# Patient Record
Sex: Male | Born: 1940 | Race: White | Hispanic: No | Marital: Married | Smoking: Former smoker
Health system: Southern US, Community
[De-identification: ages and names within clinical notes are randomized; demographics above are authoritative.]

## PROBLEM LIST (undated history)

## (undated) DIAGNOSIS — Z01810 Encounter for preprocedural cardiovascular examination: Secondary | ICD-10-CM

## (undated) DIAGNOSIS — N529 Male erectile dysfunction, unspecified: Secondary | ICD-10-CM

## (undated) DIAGNOSIS — Z9889 Other specified postprocedural states: Secondary | ICD-10-CM

## (undated) DIAGNOSIS — I10 Essential (primary) hypertension: Secondary | ICD-10-CM

## (undated) DIAGNOSIS — R17 Unspecified jaundice: Secondary | ICD-10-CM

## (undated) DIAGNOSIS — M169 Osteoarthritis of hip, unspecified: Secondary | ICD-10-CM

## (undated) DIAGNOSIS — R112 Nausea with vomiting, unspecified: Secondary | ICD-10-CM

## (undated) DIAGNOSIS — C801 Malignant (primary) neoplasm, unspecified: Secondary | ICD-10-CM

## (undated) DIAGNOSIS — B0229 Other postherpetic nervous system involvement: Secondary | ICD-10-CM

## (undated) DIAGNOSIS — M1990 Unspecified osteoarthritis, unspecified site: Secondary | ICD-10-CM

## (undated) DIAGNOSIS — I499 Cardiac arrhythmia, unspecified: Secondary | ICD-10-CM

## (undated) DIAGNOSIS — M1612 Unilateral primary osteoarthritis, left hip: Secondary | ICD-10-CM

## (undated) DIAGNOSIS — N401 Enlarged prostate with lower urinary tract symptoms: Secondary | ICD-10-CM

## (undated) DIAGNOSIS — E291 Testicular hypofunction: Secondary | ICD-10-CM

## (undated) DIAGNOSIS — I2510 Atherosclerotic heart disease of native coronary artery without angina pectoris: Secondary | ICD-10-CM

## (undated) DIAGNOSIS — D045 Carcinoma in situ of skin of trunk: Secondary | ICD-10-CM

## (undated) DIAGNOSIS — Z96642 Presence of left artificial hip joint: Secondary | ICD-10-CM

## (undated) DIAGNOSIS — E7849 Other hyperlipidemia: Secondary | ICD-10-CM

## (undated) HISTORY — DX: Unilateral primary osteoarthritis, left hip: M16.12

## (undated) HISTORY — PX: UPPER GASTROINTESTINAL ENDOSCOPY: SHX188

## (undated) HISTORY — DX: Encounter for preprocedural cardiovascular examination: Z01.810

## (undated) HISTORY — DX: Testicular hypofunction: E29.1

## (undated) HISTORY — DX: Presence of left artificial hip joint: Z96.642

## (undated) HISTORY — PX: CORONARY ANGIOPLASTY: SHX604

## (undated) HISTORY — DX: Benign prostatic hyperplasia with lower urinary tract symptoms: N40.1

## (undated) HISTORY — PX: COLON SURGERY: SHX602

## (undated) HISTORY — PX: CARDIAC CATHETERIZATION: SHX172

## (undated) HISTORY — PX: HERNIA REPAIR: SHX51

## (undated) HISTORY — PX: CHOLECYSTECTOMY: SHX55

## (undated) HISTORY — PX: COLONOSCOPY: SHX174

## (undated) HISTORY — DX: Essential (primary) hypertension: I10

## (undated) HISTORY — DX: Other hyperlipidemia: E78.49

## (undated) HISTORY — DX: Male erectile dysfunction, unspecified: N52.9

## (undated) HISTORY — DX: Osteoarthritis of hip, unspecified: M16.9

## (undated) HISTORY — DX: Atherosclerotic heart disease of native coronary artery without angina pectoris: I25.10

## (undated) HISTORY — DX: Other postherpetic nervous system involvement: B02.29

## (undated) HISTORY — PX: HIP ARTHROPLASTY: SHX981

---

## 2010-07-04 ENCOUNTER — Encounter (HOSPITAL_COMMUNITY)
Admission: RE | Admit: 2010-07-04 | Discharge: 2010-07-04 | Disposition: A | Discharge status: Home / Self Care | Payer: Medicare HMO | Source: Ambulatory Visit | Attending: Orthopedic Surgery | Admitting: Orthopedic Surgery

## 2010-07-04 ENCOUNTER — Other Ambulatory Visit (HOSPITAL_COMMUNITY): Payer: Self-pay | Admitting: Orthopedic Surgery

## 2010-07-04 ENCOUNTER — Ambulatory Visit (HOSPITAL_COMMUNITY)
Admission: RE | Admit: 2010-07-04 | Discharge: 2010-07-04 | Disposition: A | Discharge status: Home / Self Care | Payer: Medicare HMO | Source: Ambulatory Visit | Attending: Orthopedic Surgery | Admitting: Orthopedic Surgery

## 2010-07-04 DIAGNOSIS — M169 Osteoarthritis of hip, unspecified: Secondary | ICD-10-CM

## 2010-07-04 DIAGNOSIS — Z01818 Encounter for other preprocedural examination: Secondary | ICD-10-CM | POA: Insufficient documentation

## 2010-07-04 LAB — DIFFERENTIAL
Eosinophils Absolute: 0.1 10*3/uL (ref 0.0–0.7)
Eosinophils Relative: 1 % (ref 0–5)
Lymphs Abs: 2.8 10*3/uL (ref 0.7–4.0)
Monocytes Absolute: 0.5 10*3/uL (ref 0.1–1.0)

## 2010-07-04 LAB — CBC
MCH: 31.4 pg (ref 26.0–34.0)
MCHC: 34.8 g/dL (ref 30.0–36.0)
MCV: 90.1 fL (ref 78.0–100.0)
Platelets: 284 10*3/uL (ref 150–400)
RDW: 13.1 % (ref 11.5–15.5)
WBC: 8.6 10*3/uL (ref 4.0–10.5)

## 2010-07-04 LAB — URINE MICROSCOPIC-ADD ON

## 2010-07-04 LAB — TYPE AND SCREEN: ABO/RH(D): A POS

## 2010-07-04 LAB — URINALYSIS, ROUTINE W REFLEX MICROSCOPIC
Protein, ur: NEGATIVE mg/dL
Urobilinogen, UA: 1 mg/dL (ref 0.0–1.0)

## 2010-07-04 LAB — SURGICAL PCR SCREEN
MRSA, PCR: NEGATIVE
Staphylococcus aureus: NEGATIVE

## 2010-07-04 LAB — BASIC METABOLIC PANEL
Calcium: 9.3 mg/dL (ref 8.4–10.5)
Creatinine, Ser: 0.98 mg/dL (ref 0.4–1.5)
GFR calc Af Amer: >60 mL/min (ref >60)

## 2010-07-04 LAB — APTT: aPTT: 28 seconds (ref 24–37)

## 2010-07-07 ENCOUNTER — Inpatient Hospital Stay (HOSPITAL_COMMUNITY)
Admission: RE | Admit: 2010-07-07 | Discharge: 2010-07-09 | DRG: 470 | Disposition: A | Discharge status: Home Health | Payer: Medicare HMO | Source: Ambulatory Visit | Attending: Orthopedic Surgery | Admitting: Orthopedic Surgery

## 2010-07-07 ENCOUNTER — Inpatient Hospital Stay (HOSPITAL_COMMUNITY): Payer: Medicare HMO

## 2010-07-07 DIAGNOSIS — K219 Gastro-esophageal reflux disease without esophagitis: Secondary | ICD-10-CM | POA: Diagnosis present

## 2010-07-07 DIAGNOSIS — M1610 Unilateral primary osteoarthritis, unspecified hip: Principal | ICD-10-CM | POA: Diagnosis present

## 2010-07-07 DIAGNOSIS — M169 Osteoarthritis of hip, unspecified: Principal | ICD-10-CM | POA: Diagnosis present

## 2010-07-08 LAB — BASIC METABOLIC PANEL
Calcium: 8.6 mg/dL (ref 8.4–10.5)
Creatinine, Ser: 0.97 mg/dL (ref 0.4–1.5)
GFR calc non Af Amer: >60 mL/min (ref >60)
Glucose, Bld: 154 mg/dL — ABNORMAL HIGH (ref 70–99)
Sodium: 141 mEq/L (ref 135–145)

## 2010-07-08 LAB — CBC
MCH: 30.7 pg (ref 26.0–34.0)
Platelets: 266 10*3/uL (ref 150–400)
RBC: 4.36 MIL/uL (ref 4.22–5.81)
RDW: 13.2 % (ref 11.5–15.5)
WBC: 11.6 10*3/uL — ABNORMAL HIGH (ref 4.0–10.5)

## 2010-07-08 LAB — PROTIME-INR: Prothrombin Time: 13.9 seconds (ref 11.6–15.2)

## 2010-07-09 LAB — CBC
HCT: 34 % — ABNORMAL LOW (ref 39.0–52.0)
MCH: 30.8 pg (ref 26.0–34.0)
MCHC: 33.2 g/dL (ref 30.0–36.0)
MCV: 92.6 fL (ref 78.0–100.0)
Platelets: 204 10*3/uL (ref 150–400)
RDW: 13.6 % (ref 11.5–15.5)
WBC: 7.9 10*3/uL (ref 4.0–10.5)

## 2010-07-10 NOTE — Op Note (Signed)
NAME:  [name], [name]               ACCOUNT NO.:  [id number]  MEDICAL RECORD NO.:  [id number]           PATIENT TYPE:  I  LOCATION:  5034                         FACILITY:  [facility]  PHYSICIAN:  Feliberto Gottron. Turner Daniels, M.D.   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  PROCEDURE:  Right total hip arthroplasty using DePuy 60-mm Pinnacle cup, 40-mm metal liner, central occluder.  On the femoral side, +0 40-mm metal ball, 20 x 15 x 42 SROM stem, 20FXXL cone.  SURGEON:  Feliberto Gottron. Turner Daniels, MD  FIRST ASSISTANT:  Shirl Harris, PA  ANESTHETIC:  General endotracheal.  ESTIMATED BLOOD LOSS:  400 mL.  FLUID REPLACEMENT:  Crystalloid 1500 mL.  DRAINS PLACED:  None.  TOURNIQUET TIME:  None.  INDICATIONS FOR PROCEDURE:  A 70 year old man with end-stage arthritis of both hips, right worse than left, has failed conservative measures, desires elective hip replacement.  He is down to bone-on-bone changes on his x-ray with protrusio and very limited motion.  Risks and benefits of surgery discussed, questions answered.  DESCRIPTION OF PROCEDURE:  The patient identified by armband, received preoperative IV Ancef 2 g in the holding area at [facility], taken to the operating room 5, appropriate anesthetic monitors were attached, general endotracheal anesthesia induced.  The patient in supine position, rolled into the left lateral decubitus position, fixed there with a Stulberg Mark II pelvic clamp keeping the pelvis vertical. Right lower extremity prepped and draped in usual sterile fashion from the ankle to the hemipelvis.  Time-out procedure performed.  Skin along the lateral hip and thigh infiltrated with 10 mL of 0.5% Marcaine  and epinephrine solution and a 14-cm incision allowing posterolateral approach to the hip was made through the skin and subcutaneous tissue down to the level of the IT band cut in line with skin incision exposing the greater trochanter.  We then placed cobra retractors between the gluteus minimus in the superior hip joint capsule and the quadratus femoris in the inferior hip joint capsule, exposing the short external rotators and the piriformis which were tagged with a #2 Ethibond suture and cut off their insertion on the intertrochanteric crest.  This exposed the posterior aspect of the hip joint capsule which was developed into an acetabular-based flap going from posterior-superior off the acetabular rim out over the femoral neck and exiting posterior- inferior.  This was likewise tagged with two #2 Ethibond sutures and clearly exposed the arthritic femoral head which had very large posterior and inferior osteophytes.  Hip was flexed and internally rotated dislocating the arthritic femoral head and a standard neck cut performed one fingerbreadth above the lesser trochanter.  The femoral head was then extracted and the proximal femur translated anteriorly levering off the anterior column with a Hohmann retractor and placing a spike Cobra retractor in the cotyloid notch and then a posterior- inferior wing retractor at the junction of  the ischium and the acetabulum.  There was very little labrum to remove since it was all calcified from the protrusio, and using rongeurs we actually removed about one-quarter inch of the acetabular rim pretty much circumferentially to get down to the true acetabulum.  We then started out with a 55-mm basket reamer and reamed up to 59 mm obtaining good coverage in all quadrants in preparation for a 60-mm Pinnacle shell. The edge was touched with a 60-mm reamer and a 60-mm Pinnacle shell with no dome screws was hammered into place in 40 degrees of abduction  and about 20 degrees of anteversion.  More peripheral osteophytes were then removed.  Central occluder placed and a 40-mm metal liner was hammered into place.  Hip flexed and internally rotated exposing the proximal femur which was entered with a box cutting chisel followed by axial reamers going in 2-mm increments from 18 to 14 and then 1-mm increments from 14 to 15 and half millimeter increments to 15.5 mm.  We went halfway down with the 16-mm reamer and actually had gotten good chatter right around 14 mm.  We then conically reamed up to a 12F cone to the appropriate depth for a 42 base neck and then milled the calcar to an XXL calcar.  A trial 12FXXL cone was then hammered into place followed by a trial stem with a +0 40-mm head.  The patient had excessive anteversion of the femur about 45 degrees.  We retroverted the trial about 20 degrees in relation to the calcar and reduced the hip.  The hip could not be dislocated anteriorly in extension with external rotation and flexion to a 90 with internal rotation of 75 was stable.  At this point, the trial components were removed.  The wound irrigated out with normal saline solution.  A 12FXXL cone was then hammered into place followed by a 20 x 15 x 42 x 150 SROM stem retroverted 20 degrees in relation to the calcar.  This still gave [place] an anteversion of about 20 degrees.  Combined version between the cup and the stem was 40 to 45 degrees.  We then hammered into place a +0 40-mm metal head, reduced the hip, checked stability, it was excellent.  Wound irrigated out with normal saline solution at this point one more time.  Capsular flap and short external rotators were repaired back to the intertrochanteric crest through drill holes using the #2 Ethibond suture.  The IT band closed with running #1 Vicryl suture, subcutaneous tissue with 0 and 2-0 undyed Vicryl suture.  Skin was closed with running interlocking 3-0 nylon suture.  A Mepilex  dressing was applied.  The patient unclamped, rolled supine, awakened, extubated, and taken to the recovery room without difficulty.     Feliberto Gottron. Turner Daniels, M.D.     Ovid Curd  D:  07/07/2010  T:  07/07/2010  Job:  [id number]  Electronically Signed by Gean Birchwood M.D. on 07/10/2010 08:08:59 PM

## 2010-08-10 NOTE — Discharge Summary (Signed)
  NAME:  [name], [name]               ACCOUNT NO.:  [id number]  MEDICAL RECORD NO.:  [id number]           PATIENT TYPE:  I  LOCATION:  5034                         FACILITY:  [facility]  PHYSICIAN:  [name], M.D.   DATE OF BIRTH:  Jan 27, 1941  DATE OF ADMISSION:  07/07/2010 DATE OF DISCHARGE:  07/09/2010                              DISCHARGE SUMMARY   CHIEF COMPLAINT:  Right hip pain.  HISTORY OF PRESENT ILLNESS:  This is a 70 year old gentleman who complains of severe unremitting pain in his right hip despite activity modification and pain medicines.  He now desires a surgical intervention.  All risks and benefits of surgery were discussed with the patient.  PAST MEDICAL HISTORY:  Significant for peptic ulcer disease.  PAST SURGICAL HISTORY:  Significant for cholecystectomy and back surgery.  He has no known drug allergies.  SOCIAL HISTORY:  He denies the use of tobacco or alcohol.  He is married and retired.  FAMILY HISTORY:  Positive for osteoarthritis.  PHYSICAL EXAMINATION:  Gross examination of the right hip demonstrates limited range of motion and significant pain with attempts at internal rotation.  He is neurovascularly intact.  X-rays demonstrate bone-on-bone degenerative joint disease in the right hip with flattening of the femoral head.  PREOP LABS:  White blood cells 8.6, red blood cells 5.26, hemoglobin 16.5, hematocrit 47.4, platelets 284.  PT 13, INR 0.96, PTT 28.  Sodium 140, potassium 4.6, chloride 106, glucose 93, BUN 11, creatinine 0.98. Urinalysis demonstrates moderate leukocyte esterase, otherwise within normal limits.  HOSPITAL COURSE:  [name] was admitted to [facility] on July 07, 2010, when he underwent right total hip arthroplasty.  The procedure was performed by Dr. Gean Birchwood and the patient tolerated it well. Perioperative Foley catheter was placed.  He was transferred to the floor on Lovenox and Coumadin for DVT prophylaxis.  On  the first postoperative day, he was awake and alert.  He complained of nausea and vomiting.  So, his pain medicine was adjusted.  Hemoglobin was 13.4. Surgical dressing was clean.  On the second postoperative day, he was awake and alert and doing well with physical therapy.  He reported significant improvement in nausea and he was discharged home.  DISPOSITION:  The patient was discharged home on July 09, 2010.  He was weightbearing as tolerated and would return to the clinic in 10 days for x-rays and suture removal.  Home Health would manage his wound, Coumadin and physical therapy.  His target INR was 1.5 to 2.0.  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     Shirl Harris, PA   ______________________________ [name], M.D.    JW/MEDQ  D:  08/02/2010  T:  08/03/2010  Job:  [id number]  Electronically Signed by Shirl Harris PA on 08/08/2010 04:18:04 PM Electronically Signed by Gean Birchwood M.D. on 08/10/2010 07:40:18 AM

## 2015-05-05 DIAGNOSIS — Z01810 Encounter for preprocedural cardiovascular examination: Secondary | ICD-10-CM

## 2015-05-05 HISTORY — DX: Encounter for preprocedural cardiovascular examination: Z01.810

## 2015-07-01 ENCOUNTER — Other Ambulatory Visit (HOSPITAL_COMMUNITY): Payer: Self-pay | Admitting: Orthopaedic Surgery

## 2015-07-05 ENCOUNTER — Encounter (HOSPITAL_COMMUNITY): Payer: Self-pay

## 2015-07-05 ENCOUNTER — Encounter (HOSPITAL_COMMUNITY)
Admission: RE | Admit: 2015-07-05 | Discharge: 2015-07-05 | Disposition: A | Discharge status: Home / Self Care | Payer: Medicare HMO | Source: Ambulatory Visit | Attending: Orthopaedic Surgery | Admitting: Orthopaedic Surgery

## 2015-07-05 DIAGNOSIS — Z7982 Long term (current) use of aspirin: Secondary | ICD-10-CM | POA: Insufficient documentation

## 2015-07-05 DIAGNOSIS — I10 Essential (primary) hypertension: Secondary | ICD-10-CM | POA: Insufficient documentation

## 2015-07-05 DIAGNOSIS — Z7902 Long term (current) use of antithrombotics/antiplatelets: Secondary | ICD-10-CM | POA: Diagnosis not present

## 2015-07-05 DIAGNOSIS — Z87891 Personal history of nicotine dependence: Secondary | ICD-10-CM | POA: Insufficient documentation

## 2015-07-05 DIAGNOSIS — Z79899 Other long term (current) drug therapy: Secondary | ICD-10-CM | POA: Insufficient documentation

## 2015-07-05 DIAGNOSIS — I2510 Atherosclerotic heart disease of native coronary artery without angina pectoris: Secondary | ICD-10-CM | POA: Insufficient documentation

## 2015-07-05 DIAGNOSIS — M1612 Unilateral primary osteoarthritis, left hip: Secondary | ICD-10-CM | POA: Diagnosis not present

## 2015-07-05 DIAGNOSIS — Z955 Presence of coronary angioplasty implant and graft: Secondary | ICD-10-CM | POA: Insufficient documentation

## 2015-07-05 DIAGNOSIS — Z01818 Encounter for other preprocedural examination: Secondary | ICD-10-CM | POA: Diagnosis not present

## 2015-07-05 DIAGNOSIS — Z01812 Encounter for preprocedural laboratory examination: Secondary | ICD-10-CM | POA: Diagnosis not present

## 2015-07-05 HISTORY — DX: Malignant (primary) neoplasm, unspecified: C80.1

## 2015-07-05 HISTORY — DX: Carcinoma in situ of skin of trunk: D04.5

## 2015-07-05 HISTORY — DX: Essential (primary) hypertension: I10

## 2015-07-05 HISTORY — DX: Cardiac arrhythmia, unspecified: I49.9

## 2015-07-05 HISTORY — DX: Nausea with vomiting, unspecified: R11.2

## 2015-07-05 HISTORY — DX: Unspecified osteoarthritis, unspecified site: M19.90

## 2015-07-05 HISTORY — DX: Unspecified jaundice: R17

## 2015-07-05 HISTORY — DX: Other specified postprocedural states: Z98.890

## 2015-07-05 LAB — BASIC METABOLIC PANEL
ANION GAP: 10 (ref 5–15)
BUN: 23 mg/dL — ABNORMAL HIGH (ref 6–20)
CO2: 26 mmol/L (ref 22–32)
Calcium: 9.4 mg/dL (ref 8.9–10.3)
Chloride: 106 mmol/L (ref 101–111)
Creatinine, Ser: 1.23 mg/dL (ref 0.61–1.24)
GFR calc Af Amer: >60 mL/min (ref >60)
GFR, EST NON AFRICAN AMERICAN: 57 mL/min — AB (ref >60)
GLUCOSE: 93 mg/dL (ref 65–99)
POTASSIUM: 4.4 mmol/L (ref 3.5–5.1)
Sodium: 142 mmol/L (ref 135–145)

## 2015-07-05 LAB — CBC
HEMATOCRIT: 43.9 % (ref 39.0–52.0)
HEMOGLOBIN: 14.4 g/dL (ref 13.0–17.0)
MCH: 30.7 pg (ref 26.0–34.0)
MCHC: 32.8 g/dL (ref 30.0–36.0)
MCV: 93.6 fL (ref 78.0–100.0)
Platelets: 185 10*3/uL (ref 150–400)
RBC: 4.69 MIL/uL (ref 4.22–5.81)
RDW: 13.1 % (ref 11.5–15.5)
WBC: 9.1 10*3/uL (ref 4.0–10.5)

## 2015-07-05 LAB — SURGICAL PCR SCREEN
MRSA, PCR: NEGATIVE
STAPHYLOCOCCUS AUREUS: NEGATIVE

## 2015-07-05 NOTE — Pre-Procedure Instructions (Signed)
[name]  07/05/2015      [facility], [place] [id number] [place] [id number] Phone: [phone] Fax: [phone]    Your procedure is scheduled on Tuesday  07/12/15  Report to [facility] Admitting at 130 P.M.  Call this number if you have problems the morning of surgery:  [phone]   Remember:  Do not eat food or drink liquids after midnight.  Take these medicines the morning of surgery with A SIP OF WATER  HYDROCODONE IF NEEDED, LORATADINE, TAMSULOSIN (FLOMAX)     (STOP ASPIRIN AND PLAVIX AS DIRECTED, STOP MULTIVITAMIN, VITAMINS, HERBAL MEDICINES)   Do not wear jewelry, make-up or nail polish.  Do not wear lotions, powders, or perfumes.  You may wear deodorant.  Do not shave 48 hours prior to surgery.  Men may shave face and neck.  Do not bring valuables to the hospital.  [facility] is not responsible for any belongings or valuables.  Contacts, dentures or bridgework may not be worn into surgery.  Leave your suitcase in the car.  After surgery it may be brought to your room.  For patients admitted to the hospital, discharge time will be determined by your treatment team.  Patients discharged the day of surgery will not be allowed to drive home.   Name and phone number of your driver:   Special instructions:  [place] - Preparing for Surgery  Before surgery, you can play an important role.  Because skin is not sterile, your skin needs to be as free of germs as possible.  You can reduce the number of germs on you skin by washing with CHG (chlorahexidine gluconate) soap before surgery.  CHG is an antiseptic cleaner which kills germs and bonds with the skin to continue killing germs even after washing.  Please DO NOT use if you have an allergy to CHG or antibacterial soaps.  If your skin becomes reddened/irritated stop using the CHG and inform your nurse when you arrive at Short Stay.  Do  not shave (including legs and underarms) for at least 48 hours prior to the first CHG shower.  You may shave your face.  Please follow these instructions carefully:   1.  Shower with CHG Soap the night before surgery and the                                morning of Surgery.  2.  If you choose to wash your hair, wash your hair first as usual with your       normal shampoo.  3.  After you shampoo, rinse your hair and body thoroughly to remove the                      Shampoo.  4.  Use CHG as you would any other liquid soap.  You can apply chg directly       to the skin and wash gently with scrungie or a clean washcloth.  5.  Apply the CHG Soap to your body ONLY FROM THE NECK DOWN.        Do not use on open wounds or open sores.  Avoid contact with your eyes,       ears, mouth and genitals (private parts).  Wash genitals (private parts)       with your normal  soap.  6.  Wash thoroughly, paying special attention to the area where your surgery        will be performed.  7.  Thoroughly rinse your body with warm water from the neck down.  8.  DO NOT shower/wash with your normal soap after using and rinsing off       the CHG Soap.  9.  Pat yourself dry with a clean towel.            10.  Wear clean pajamas.            11.  Place clean sheets on your bed the night of your first shower and do not        sleep with pets.  Day of Surgery  Do not apply any lotions/deoderants the morning of surgery.  Please wear clean clothes to the hospital/surgery center.    Please read over the following fact sheets that you were given. Pain Booklet, Coughing and Deep Breathing, Total Joint Packet, MRSA Information and Surgical Site Infection Prevention

## 2015-07-05 NOTE — Progress Notes (Signed)
   07/05/15 1407  OBSTRUCTIVE SLEEP APNEA  Have you ever been diagnosed with sleep apnea through a sleep study? No  Do you snore loudly (loud enough to be heard through closed doors)?  1  Do you often feel tired, fatigued, or sleepy during the daytime (such as falling asleep during driving or talking to someone)? 0  Has anyone observed you stop breathing during your sleep? 1  Do you have, or are you being treated for high blood pressure? 1  BMI more than 35 kg/m2? 0  Age > 50 (1-yes) 1  Neck circumference greater than:Male 16 inches or larger, Male 17inches or larger? 0  Male Gender (Yes=1) 1  Obstructive Sleep Apnea Score 5  Score 5 or greater  Results sent to PCP

## 2015-07-07 ENCOUNTER — Encounter (HOSPITAL_COMMUNITY): Payer: Self-pay | Admitting: Vascular Surgery

## 2015-07-07 NOTE — Progress Notes (Addendum)
Anesthesia Chart Review: Patient is a 75 year old male scheduled for left THA, anterior approach on 07/12/15 by [name].   History includes former smoker, HTN, post-operative N/V, CAD s/p PCI, dysrhythmia, skin cancer (SCC), childhood jaundice, cholecystectomy.  PCP is [name]. Cardiologist is [name] in [place], last visit 05/18/15 (see Care Everywhere). He also saw [name] on 05/05/15 for pre-operative assessment for colonoscopy. His note states, "As far as his preoperative risk stratification evaluation is concerned I recommended. He undergo Lexiscan sestamibi to assess for any objective evidence of obstructive coronary artery disease at this time. His because he leads a very sedentary lifestyle and has multiple risk factors for coronary artery disease. He and his wife tell me that they're not keen on it at this time and I respect her wishes."  Meds include ASA 81mg , Plavix, Norco, Lopressor, Claritin, Nitro, Zocor, Flomax, valsartan-HCTZ. Plavix held starting 07/05/15.  PAT Vitals: BP 131/50, HR 59, RR 20, T 36.8C, 98% RA.  07/05/15 EKG: SB at 58 bpm.  Preoperative labs noted.   Records still pending from [facility], [facility], and [facility]. I will also inquire from [name] office if any surgical clearance was obtained.  [name] [facility] Short Stay Center/Anesthesiology Phone [phone] 07/07/2015 6:06 PM  Addendum:   12/12/12 Stress Echo [facility]):  ECG showed ischemic ST changes. Overall LV systolic function is normal with EF between 55-60%. Echo images at peak stress demonstrated new regional wall motion abnormalities with hypokinesia of the mid anteroseptal segment and akinesia of the basilar septal and basilar inferior segments, this is a multivessel pattern of ischemia. EKG and echocardiographic evidence of inducible ischemia at achieved workload (multivessel CAD). LHC recommended.  12/17/12  Cardiac cath [facility]):  LMCA: Normal LAD: Lesion on Prox LAD:  Distal subsection. 80% stenosis.  DIAG1: Mid subsection. 75%. LCX: Normal RCA: Proximal subsection. 95% stenosis. RAMUS: 30% stenosis. SUMMARY:  1. Severe Prox LAD, DIAG, and RCA disease. 2. LV gram not done due to dye load, LVEF is 55% by stress echo. INTERVENTIONS: Successful PCI/Xience DES of the proximal LAD. Successful PCI/Xience DES of the mid Braxton. Successful PCI/Xience DES of the proximal RCA. RECOMMENDATIONS: Dual anti-platelet therapy with Ticagrelor and ASA 81 mg for at least 12 months is recommended.  He is only 2 1/2 years out from his coronary PCI, but pre-operative evaluation with [name] on 05/05/15 for colonoscopy recommended a Lexiscan. Patient was not keen on this idea at the time. Patient has since been evaluated by [name] (his reported primary cardiologist), but there is no mention that he is aware of plans for THA. I discussed above with anesthesiologist [name]. Since patient is now scheduled for a intermediate risk procedure, we would like cardiology to clarify if additional preoperative testing is felt indicated prior to this procedure. I notified Sherrie at [name] office earlier today. She will get follow-up with cardiology for input.  [name] [facility] Short Stay Center/Anesthesiology Phone [phone] 07/08/2015 5:39 PM

## 2015-07-11 ENCOUNTER — Telehealth (HOSPITAL_COMMUNITY): Payer: Self-pay | Admitting: Vascular Surgery

## 2015-07-11 NOTE — Progress Notes (Addendum)
Left pt. Message of time change on primary phone line. Instructed him to arrive at 1255 on 07/12/15. Asked to return call that he received this notification.  Spoke to sherrie @ Dr. Trevor Mace office concerning cardiology clearance. She stated at present no up dates to tell me.

## 2015-07-11 NOTE — Progress Notes (Addendum)
Anesthesia follow-up: See my anesthesia note from 07/07/15 with 07/08/15 addendum. Sherrie contacted cardiologist Dr. Joya Gaskins office for clearance but was told he could not see patient for another few weeks. Left THA scheduled for 07/12/15 was canceled. In the meantime, patient was able to get an appointment with Dr. Danie Binder (partner to Dr. Bettina Gavia) this afternoon. His note dated 07/11/15 (see Care Everywhere), states, "Patient is at low/acceptable cardiac risk for planned hip arthroplasty tomorrow, and no further preoperative cardiac testing is required. PLAN: Proceed with the hip arthroplasty tomorrow as planned. Follow-up with Dr. Bettina Gavia per routine. The patient is anticoagulated postoperatively I would treat with clopidogrel only. Resume aspirin plus clopidogrel ASAP postoperatively. All other medications including the metoprolol should be continued perioperatively." I notified [place]. She will have to discuss with Dr. Ninfa Linden when patient can be added back on to the OR schedule.  07/11/15 EKG ([facility]): SR at 72 bpm.  Myra Gianotti, PA-C [facility] Short Stay Center/Anesthesiology Phone [phone] 07/11/2015 4:56 PM

## 2015-07-12 ENCOUNTER — Inpatient Hospital Stay (HOSPITAL_COMMUNITY): Admission: RE | Admit: 2015-07-12 | Payer: Medicare HMO | Source: Ambulatory Visit | Admitting: Orthopaedic Surgery

## 2015-07-12 ENCOUNTER — Encounter (HOSPITAL_COMMUNITY): Admission: RE | Payer: Self-pay | Source: Ambulatory Visit

## 2015-07-12 SURGERY — ARTHROPLASTY, HIP, TOTAL, ANTERIOR APPROACH
Anesthesia: Choice | Laterality: Left

## 2015-07-19 ENCOUNTER — Encounter (HOSPITAL_COMMUNITY)
Admission: RE | Admit: 2015-07-19 | Discharge: 2015-07-19 | Disposition: A | Discharge status: Home / Self Care | Payer: Medicare HMO | Source: Ambulatory Visit | Attending: Orthopaedic Surgery | Admitting: Orthopaedic Surgery

## 2015-07-19 LAB — BASIC METABOLIC PANEL
ANION GAP: 11 (ref 5–15)
BUN: 17 mg/dL (ref 6–20)
CALCIUM: 9.3 mg/dL (ref 8.9–10.3)
CO2: 23 mmol/L (ref 22–32)
Chloride: 107 mmol/L (ref 101–111)
Creatinine, Ser: 1.37 mg/dL — ABNORMAL HIGH (ref 0.61–1.24)
GFR, EST AFRICAN AMERICAN: 58 mL/min — AB (ref >60)
GFR, EST NON AFRICAN AMERICAN: 50 mL/min — AB (ref >60)
Glucose, Bld: 97 mg/dL (ref 65–99)
Potassium: 4.5 mmol/L (ref 3.5–5.1)
Sodium: 141 mmol/L (ref 135–145)

## 2015-07-19 LAB — CBC
HCT: 41.2 % (ref 39.0–52.0)
HEMOGLOBIN: 13.9 g/dL (ref 13.0–17.0)
MCH: 31.4 pg (ref 26.0–34.0)
MCHC: 33.7 g/dL (ref 30.0–36.0)
MCV: 93 fL (ref 78.0–100.0)
Platelets: 198 10*3/uL (ref 150–400)
RBC: 4.43 MIL/uL (ref 4.22–5.81)
RDW: 13.1 % (ref 11.5–15.5)
WBC: 8.6 10*3/uL (ref 4.0–10.5)

## 2015-07-19 NOTE — Progress Notes (Signed)
Anesthesia follow-up: Patient is a 75 year old male scheduled for left THA, anterior approach on 07/21/15 by Dr. Jean Rosenthal. Procedure was initially cancelled due to need for cardiology clearance. See my notes from 07/08/15-07/11/15. Today he came in for updated labs.   Last dose Plavix was 07/15/15. Reportedly was told to hold for 6 days per Dr. Ninfa Linden.  Preoperative labs noted. Cr 1.37. CBC WNL.   Definitive anesthesia plan following evaluation on the day of surgery. Since Plavix only held for 6 days, he will likely get general anesthesia. I left a voice message with Sherrie at Dr. Trevor Mace office relaying this. Anesthesiologist Dr. Gifford Shave also updated.  [facility] Short Stay Center/Anesthesiology Phone [phone] 07/19/2015 5:31 PM

## 2015-07-20 MED ORDER — CEFAZOLIN SODIUM-DEXTROSE 2-3 GM-% IV SOLR
2.0000 g | INTRAVENOUS | Status: AC
  Administered 2015-07-21: 2 g via INTRAVENOUS
  Filled 2015-07-20: qty 50

## 2015-07-20 MED ORDER — TRANEXAMIC ACID 1000 MG/10ML IV SOLN
1000.0000 mg | INTRAVENOUS | Status: DC
  Filled 2015-07-20: qty 10

## 2015-07-21 ENCOUNTER — Encounter (HOSPITAL_COMMUNITY)
Admission: RE | Disposition: A | Discharge status: Home Health | Payer: Self-pay | Source: Ambulatory Visit | Attending: Orthopaedic Surgery

## 2015-07-21 ENCOUNTER — Inpatient Hospital Stay (HOSPITAL_COMMUNITY): Payer: Medicare HMO

## 2015-07-21 ENCOUNTER — Encounter (HOSPITAL_COMMUNITY): Payer: Self-pay | Admitting: *Deleted

## 2015-07-21 ENCOUNTER — Inpatient Hospital Stay (HOSPITAL_COMMUNITY): Payer: Medicare HMO | Admitting: Vascular Surgery

## 2015-07-21 ENCOUNTER — Inpatient Hospital Stay (HOSPITAL_COMMUNITY)
Admission: RE | Admit: 2015-07-21 | Discharge: 2015-07-23 | DRG: 470 | Disposition: A | Discharge status: Home Health | Payer: Medicare HMO | Source: Ambulatory Visit | Attending: Orthopaedic Surgery | Admitting: Orthopaedic Surgery

## 2015-07-21 ENCOUNTER — Inpatient Hospital Stay (HOSPITAL_COMMUNITY): Payer: Medicare HMO | Admitting: Anesthesiology

## 2015-07-21 DIAGNOSIS — N4289 Other specified disorders of prostate: Secondary | ICD-10-CM | POA: Diagnosis present

## 2015-07-21 DIAGNOSIS — M1612 Unilateral primary osteoarthritis, left hip: Principal | ICD-10-CM | POA: Diagnosis present

## 2015-07-21 DIAGNOSIS — Z96642 Presence of left artificial hip joint: Secondary | ICD-10-CM

## 2015-07-21 DIAGNOSIS — Z96641 Presence of right artificial hip joint: Secondary | ICD-10-CM | POA: Diagnosis present

## 2015-07-21 DIAGNOSIS — Z6829 Body mass index (BMI) 29.0-29.9, adult: Secondary | ICD-10-CM

## 2015-07-21 DIAGNOSIS — Z79899 Other long term (current) drug therapy: Secondary | ICD-10-CM

## 2015-07-21 DIAGNOSIS — Z87891 Personal history of nicotine dependence: Secondary | ICD-10-CM

## 2015-07-21 DIAGNOSIS — Z7982 Long term (current) use of aspirin: Secondary | ICD-10-CM

## 2015-07-21 DIAGNOSIS — I10 Essential (primary) hypertension: Secondary | ICD-10-CM | POA: Diagnosis present

## 2015-07-21 DIAGNOSIS — Z7902 Long term (current) use of antithrombotics/antiplatelets: Secondary | ICD-10-CM

## 2015-07-21 DIAGNOSIS — M1990 Unspecified osteoarthritis, unspecified site: Secondary | ICD-10-CM

## 2015-07-21 DIAGNOSIS — M169 Osteoarthritis of hip, unspecified: Secondary | ICD-10-CM

## 2015-07-21 HISTORY — DX: Osteoarthritis of hip, unspecified: M16.9

## 2015-07-21 HISTORY — DX: Presence of left artificial hip joint: Z96.642

## 2015-07-21 HISTORY — PX: TOTAL HIP ARTHROPLASTY: SHX124

## 2015-07-21 HISTORY — DX: Unilateral primary osteoarthritis, left hip: M16.12

## 2015-07-21 SURGERY — ARTHROPLASTY, HIP, TOTAL, ANTERIOR APPROACH
Anesthesia: General | Site: Hip | Laterality: Left

## 2015-07-21 MED ORDER — ONDANSETRON HCL 4 MG PO TABS: 4.0000 mg | ORAL_TABLET | Freq: Four times a day (QID) | ORAL | Status: DC | PRN

## 2015-07-21 MED ORDER — DEXTROSE 5 % IV SOLN
10.0000 mg | INTRAVENOUS | Status: DC | PRN
  Administered 2015-07-21: 25 ug/min via INTRAVENOUS

## 2015-07-21 MED ORDER — CLOPIDOGREL BISULFATE 75 MG PO TABS
75.0000 mg | ORAL_TABLET | Freq: Every day | ORAL | Status: DC
  Administered 2015-07-21 – 2015-07-23 (×3): 75 mg via ORAL
  Filled 2015-07-21 (×3): qty 1

## 2015-07-21 MED ORDER — SODIUM CHLORIDE 0.9 % IR SOLN
Status: DC | PRN
  Administered 2015-07-21: 1000 mL

## 2015-07-21 MED ORDER — DOCUSATE SODIUM 100 MG PO CAPS
100.0000 mg | ORAL_CAPSULE | Freq: Two times a day (BID) | ORAL | Status: DC
  Administered 2015-07-21 – 2015-07-23 (×4): 100 mg via ORAL
  Filled 2015-07-21 (×4): qty 1

## 2015-07-21 MED ORDER — CEFAZOLIN SODIUM 1-5 GM-% IV SOLN
1.0000 g | Freq: Four times a day (QID) | INTRAVENOUS | Status: AC
  Administered 2015-07-21 (×2): 1 g via INTRAVENOUS
  Filled 2015-07-21 (×2): qty 50

## 2015-07-21 MED ORDER — METHOCARBAMOL 500 MG PO TABS
ORAL_TABLET | ORAL | Status: AC
  Filled 2015-07-21: qty 1

## 2015-07-21 MED ORDER — LIDOCAINE HCL (CARDIAC) 20 MG/ML IV SOLN
INTRAVENOUS | Status: DC | PRN
  Administered 2015-07-21: 60 mg via INTRAVENOUS

## 2015-07-21 MED ORDER — ACETAMINOPHEN 10 MG/ML IV SOLN
INTRAVENOUS | Status: AC
  Filled 2015-07-21: qty 100

## 2015-07-21 MED ORDER — ALUM & MAG HYDROXIDE-SIMETH 200-200-20 MG/5ML PO SUSP: 30.0000 mL | ORAL | Status: DC | PRN

## 2015-07-21 MED ORDER — ADULT MULTIVITAMIN W/MINERALS CH
1.0000 | ORAL_TABLET | Freq: Every day | ORAL | Status: DC
  Administered 2015-07-22 – 2015-07-23 (×2): 1 via ORAL
  Filled 2015-07-21 (×2): qty 1

## 2015-07-21 MED ORDER — ASPIRIN EC 325 MG PO TBEC
325.0000 mg | DELAYED_RELEASE_TABLET | Freq: Every day | ORAL | Status: DC
  Administered 2015-07-22 – 2015-07-23 (×2): 325 mg via ORAL
  Filled 2015-07-21 (×2): qty 1

## 2015-07-21 MED ORDER — TAMSULOSIN HCL 0.4 MG PO CAPS
0.4000 mg | ORAL_CAPSULE | Freq: Every day | ORAL | Status: DC
  Administered 2015-07-21 – 2015-07-23 (×3): 0.4 mg via ORAL
  Filled 2015-07-21 (×3): qty 1

## 2015-07-21 MED ORDER — POLYETHYLENE GLYCOL 3350 17 G PO PACK: 17.0000 g | PACK | Freq: Every day | ORAL | Status: DC | PRN

## 2015-07-21 MED ORDER — HYDROMORPHONE HCL 1 MG/ML IJ SOLN
INTRAMUSCULAR | Status: AC
  Filled 2015-07-21: qty 1

## 2015-07-21 MED ORDER — MIDAZOLAM HCL 5 MG/5ML IJ SOLN
INTRAMUSCULAR | Status: DC | PRN
  Administered 2015-07-21: 2 mg via INTRAVENOUS

## 2015-07-21 MED ORDER — METOCLOPRAMIDE HCL 5 MG PO TABS: 5.0000 mg | ORAL_TABLET | Freq: Three times a day (TID) | ORAL | Status: DC | PRN

## 2015-07-21 MED ORDER — METOPROLOL TARTRATE 25 MG PO TABS
25.0000 mg | ORAL_TABLET | Freq: Every day | ORAL | Status: DC
  Administered 2015-07-21 – 2015-07-22 (×2): 25 mg via ORAL
  Filled 2015-07-21 (×2): qty 1

## 2015-07-21 MED ORDER — ACETAMINOPHEN 650 MG RE SUPP: 650.0000 mg | Freq: Four times a day (QID) | RECTAL | Status: DC | PRN

## 2015-07-21 MED ORDER — PHENYLEPHRINE HCL 10 MG/ML IJ SOLN
INTRAMUSCULAR | Status: DC | PRN
  Administered 2015-07-21 (×2): 120 ug via INTRAVENOUS

## 2015-07-21 MED ORDER — LACTATED RINGERS IV SOLN
INTRAVENOUS | Status: DC
  Administered 2015-07-21 (×2): via INTRAVENOUS

## 2015-07-21 MED ORDER — 0.9 % SODIUM CHLORIDE (POUR BTL) OPTIME
TOPICAL | Status: DC | PRN
  Administered 2015-07-21: 1000 mL

## 2015-07-21 MED ORDER — HYDROMORPHONE HCL 1 MG/ML IJ SOLN
0.2500 mg | INTRAMUSCULAR | Status: DC | PRN
  Administered 2015-07-21: 0.5 mg via INTRAVENOUS

## 2015-07-21 MED ORDER — VALSARTAN-HYDROCHLOROTHIAZIDE 320-12.5 MG PO TABS: 1.0000 | ORAL_TABLET | Freq: Every day | ORAL | Status: DC

## 2015-07-21 MED ORDER — LACTATED RINGERS IV SOLN: INTRAVENOUS | Status: DC

## 2015-07-21 MED ORDER — PROPOFOL 10 MG/ML IV BOLUS
INTRAVENOUS | Status: DC | PRN
  Administered 2015-07-21: 150 mg via INTRAVENOUS

## 2015-07-21 MED ORDER — METHOCARBAMOL 500 MG PO TABS
500.0000 mg | ORAL_TABLET | Freq: Four times a day (QID) | ORAL | Status: DC | PRN
  Administered 2015-07-21 – 2015-07-22 (×3): 500 mg via ORAL
  Filled 2015-07-21 (×3): qty 1

## 2015-07-21 MED ORDER — METHOCARBAMOL 1000 MG/10ML IJ SOLN
500.0000 mg | Freq: Four times a day (QID) | INTRAVENOUS | Status: DC | PRN
  Filled 2015-07-21: qty 5

## 2015-07-21 MED ORDER — HYDROMORPHONE HCL 1 MG/ML IJ SOLN
1.0000 mg | INTRAMUSCULAR | Status: DC | PRN
  Administered 2015-07-21 – 2015-07-22 (×3): 1 mg via INTRAVENOUS
  Filled 2015-07-21 (×4): qty 1

## 2015-07-21 MED ORDER — NEOSTIGMINE METHYLSULFATE 10 MG/10ML IV SOLN
INTRAVENOUS | Status: DC | PRN
  Administered 2015-07-21: 4 mg via INTRAVENOUS

## 2015-07-21 MED ORDER — ZOLPIDEM TARTRATE 5 MG PO TABS: 5.0000 mg | ORAL_TABLET | Freq: Every evening | ORAL | Status: DC | PRN

## 2015-07-21 MED ORDER — LORATADINE 10 MG PO TABS
10.0000 mg | ORAL_TABLET | Freq: Every day | ORAL | Status: DC
  Administered 2015-07-22 – 2015-07-23 (×2): 10 mg via ORAL
  Filled 2015-07-21 (×2): qty 1

## 2015-07-21 MED ORDER — FENTANYL CITRATE (PF) 100 MCG/2ML IJ SOLN
INTRAMUSCULAR | Status: DC | PRN
  Administered 2015-07-21: 100 ug via INTRAVENOUS
  Administered 2015-07-21 (×2): 50 ug via INTRAVENOUS
  Administered 2015-07-21 (×2): 100 ug via INTRAVENOUS

## 2015-07-21 MED ORDER — OXYCODONE HCL 5 MG PO TABS
5.0000 mg | ORAL_TABLET | ORAL | Status: DC | PRN
  Administered 2015-07-21: 10 mg via ORAL
  Administered 2015-07-21 – 2015-07-22 (×4): 15 mg via ORAL
  Administered 2015-07-22: 10 mg via ORAL
  Administered 2015-07-23: 15 mg via ORAL
  Filled 2015-07-21 (×4): qty 3
  Filled 2015-07-21: qty 2
  Filled 2015-07-21: qty 3
  Filled 2015-07-21: qty 2

## 2015-07-21 MED ORDER — ONDANSETRON HCL 4 MG/2ML IJ SOLN: 4.0000 mg | Freq: Four times a day (QID) | INTRAMUSCULAR | Status: DC | PRN

## 2015-07-21 MED ORDER — DIPHENHYDRAMINE HCL 12.5 MG/5ML PO ELIX: 12.5000 mg | ORAL_SOLUTION | ORAL | Status: DC | PRN

## 2015-07-21 MED ORDER — OXYCODONE HCL 5 MG PO TABS
ORAL_TABLET | ORAL | Status: AC
  Filled 2015-07-21: qty 3

## 2015-07-21 MED ORDER — SIMVASTATIN 40 MG PO TABS
40.0000 mg | ORAL_TABLET | Freq: Every evening | ORAL | Status: DC
  Administered 2015-07-21 – 2015-07-22 (×2): 40 mg via ORAL
  Filled 2015-07-21 (×2): qty 1

## 2015-07-21 MED ORDER — ROCURONIUM BROMIDE 100 MG/10ML IV SOLN
INTRAVENOUS | Status: DC | PRN
  Administered 2015-07-21: 50 mg via INTRAVENOUS

## 2015-07-21 MED ORDER — HYDROMORPHONE HCL 1 MG/ML IJ SOLN
0.2500 mg | INTRAMUSCULAR | Status: DC | PRN
  Administered 2015-07-21 (×2): 1 mg via INTRAVENOUS

## 2015-07-21 MED ORDER — GLYCOPYRROLATE 0.2 MG/ML IJ SOLN
INTRAMUSCULAR | Status: DC | PRN
  Administered 2015-07-21: 0.6 mg via INTRAVENOUS

## 2015-07-21 MED ORDER — IRBESARTAN 300 MG PO TABS
300.0000 mg | ORAL_TABLET | Freq: Every day | ORAL | Status: DC
  Administered 2015-07-22: 300 mg via ORAL
  Filled 2015-07-21 (×2): qty 1

## 2015-07-21 MED ORDER — SODIUM CHLORIDE 0.9 % IV SOLN
INTRAVENOUS | Status: DC
  Administered 2015-07-21: 16:00:00 via INTRAVENOUS

## 2015-07-21 MED ORDER — PHENOL 1.4 % MT LIQD: 1.0000 | OROMUCOSAL | Status: DC | PRN

## 2015-07-21 MED ORDER — MENTHOL 3 MG MT LOZG: 1.0000 | LOZENGE | OROMUCOSAL | Status: DC | PRN

## 2015-07-21 MED ORDER — HYDROCHLOROTHIAZIDE 12.5 MG PO CAPS
12.5000 mg | ORAL_CAPSULE | Freq: Every day | ORAL | Status: DC
  Administered 2015-07-22: 12.5 mg via ORAL
  Filled 2015-07-21 (×2): qty 1

## 2015-07-21 MED ORDER — ACETAMINOPHEN 10 MG/ML IV SOLN
1000.0000 mg | Freq: Once | INTRAVENOUS | Status: AC
  Administered 2015-07-21: 1000 mg via INTRAVENOUS

## 2015-07-21 MED ORDER — METOCLOPRAMIDE HCL 5 MG/ML IJ SOLN: 5.0000 mg | Freq: Three times a day (TID) | INTRAMUSCULAR | Status: DC | PRN

## 2015-07-21 MED ORDER — ACETAMINOPHEN 325 MG PO TABS
650.0000 mg | ORAL_TABLET | Freq: Four times a day (QID) | ORAL | Status: DC | PRN
  Administered 2015-07-22: 650 mg via ORAL
  Filled 2015-07-21: qty 2

## 2015-07-21 SURGICAL SUPPLY — 54 items
BENZOIN TINCTURE PRP APPL 2/3 (GAUZE/BANDAGES/DRESSINGS) ×3 IMPLANT
BLADE SAG 18X100X1.27 (BLADE) ×3 IMPLANT
BLADE SAW SGTL 18X1.27X75 (BLADE) ×2 IMPLANT
BLADE SAW SGTL 18X1.27X75MM (BLADE) ×1
BLADE SURG ROTATE 9660 (MISCELLANEOUS) IMPLANT
CAPT HIP TOTAL 2 ×3 IMPLANT
CELLS DAT CNTRL 66122 CELL SVR (MISCELLANEOUS) ×1 IMPLANT
CLOSURE STERI-STRIP 1/2X4 (GAUZE/BANDAGES/DRESSINGS) ×1
CLOSURE WOUND 1/2 X4 (GAUZE/BANDAGES/DRESSINGS) ×2
CLSR STERI-STRIP ANTIMIC 1/2X4 (GAUZE/BANDAGES/DRESSINGS) ×2 IMPLANT
COVER SURGICAL LIGHT HANDLE (MISCELLANEOUS) ×3 IMPLANT
DRAPE C-ARM 42X72 X-RAY (DRAPES) ×3 IMPLANT
DRAPE STERI IOBAN 125X83 (DRAPES) ×3 IMPLANT
DRAPE U-SHAPE 47X51 STRL (DRAPES) ×9 IMPLANT
DRSG AQUACEL AG ADV 3.5X10 (GAUZE/BANDAGES/DRESSINGS) ×3 IMPLANT
DURAPREP 26ML APPLICATOR (WOUND CARE) ×3 IMPLANT
ELECT BLADE 4.0 EZ CLEAN MEGAD (MISCELLANEOUS) ×3
ELECT BLADE 6.5 EXT (BLADE) IMPLANT
ELECT REM PT RETURN 9FT ADLT (ELECTROSURGICAL) ×3
ELECTRODE BLDE 4.0 EZ CLN MEGD (MISCELLANEOUS) ×1 IMPLANT
ELECTRODE REM PT RTRN 9FT ADLT (ELECTROSURGICAL) ×1 IMPLANT
FACESHIELD WRAPAROUND (MASK) ×9 IMPLANT
GLOVE BIOGEL PI IND STRL 8 (GLOVE) ×3 IMPLANT
GLOVE BIOGEL PI INDICATOR 8 (GLOVE) ×6
GLOVE ECLIPSE 8.0 STRL XLNG CF (GLOVE) ×3 IMPLANT
GLOVE ECLIPSE 8.5 STRL (GLOVE) ×3 IMPLANT
GLOVE ORTHO TXT STRL SZ7.5 (GLOVE) ×6 IMPLANT
GOWN STRL REUS W/ TWL LRG LVL3 (GOWN DISPOSABLE) ×2 IMPLANT
GOWN STRL REUS W/ TWL XL LVL3 (GOWN DISPOSABLE) ×2 IMPLANT
GOWN STRL REUS W/TWL LRG LVL3 (GOWN DISPOSABLE) ×4
GOWN STRL REUS W/TWL XL LVL3 (GOWN DISPOSABLE) ×4
HANDPIECE INTERPULSE COAX TIP (DISPOSABLE) ×4
KIT BASIN OR (CUSTOM PROCEDURE TRAY) ×3 IMPLANT
KIT ROOM TURNOVER OR (KITS) ×3 IMPLANT
MANIFOLD NEPTUNE II (INSTRUMENTS) ×3 IMPLANT
NS IRRIG 1000ML POUR BTL (IV SOLUTION) ×3 IMPLANT
PACK TOTAL JOINT (CUSTOM PROCEDURE TRAY) ×3 IMPLANT
PAD ARMBOARD 7.5X6 YLW CONV (MISCELLANEOUS) ×3 IMPLANT
RTRCTR WOUND ALEXIS 18CM MED (MISCELLANEOUS) ×3
SET HNDPC FAN SPRY TIP SCT (DISPOSABLE) ×2 IMPLANT
STAPLER VISISTAT 35W (STAPLE) IMPLANT
STRIP CLOSURE SKIN 1/2X4 (GAUZE/BANDAGES/DRESSINGS) ×4 IMPLANT
SUT ETHIBOND NAB CT1 #1 30IN (SUTURE) ×3 IMPLANT
SUT MNCRL AB 4-0 PS2 18 (SUTURE) ×3 IMPLANT
SUT VIC AB 0 CT1 27 (SUTURE) ×2
SUT VIC AB 0 CT1 27XBRD ANBCTR (SUTURE) ×1 IMPLANT
SUT VIC AB 1 CT1 27 (SUTURE) ×2
SUT VIC AB 1 CT1 27XBRD ANBCTR (SUTURE) ×1 IMPLANT
SUT VIC AB 2-0 CT1 27 (SUTURE) ×2
SUT VIC AB 2-0 CT1 TAPERPNT 27 (SUTURE) ×1 IMPLANT
TOWEL OR 17X24 6PK STRL BLUE (TOWEL DISPOSABLE) ×3 IMPLANT
TOWEL OR 17X26 10 PK STRL BLUE (TOWEL DISPOSABLE) ×3 IMPLANT
TRAY FOLEY CATH 16FRSI W/METER (SET/KITS/TRAYS/PACK) IMPLANT
WATER STERILE IRR 1000ML POUR (IV SOLUTION) ×6 IMPLANT

## 2015-07-21 NOTE — Clinical Social Work Note (Signed)
CSW received referral for SNF.  Case discussed with case manager, and plan is to discharge home.  CSW to sign off please re-consult if social work needs arise.  Valera Vallas R. Uday Jantz, MSW, LCSWA [phone]  

## 2015-07-21 NOTE — Anesthesia Postprocedure Evaluation (Signed)
Anesthesia Post Note  Patient: [name]  Procedure(s) Performed: Procedure(s) (LRB): LEFT TOTAL HIP ARTHROPLASTY ANTERIOR APPROACH (Left)  Patient location during evaluation: PACU Anesthesia Type: General Level of consciousness: awake and alert Pain management: pain level controlled Vital Signs Assessment: post-procedure vital signs reviewed and stable Respiratory status: spontaneous breathing, nonlabored ventilation, respiratory function stable and patient connected to nasal cannula oxygen Cardiovascular status: blood pressure returned to baseline and stable Postop Assessment: no signs of nausea or vomiting Anesthetic complications: no    Last Vitals:  Filed Vitals:   07/21/15 1345 07/21/15 1400  BP: 135/62 121/62  Pulse:  70  Temp:  36.5 C  Resp:  18    Last Pain: There were no vitals filed for this visit.               Tiras Bianchini L

## 2015-07-21 NOTE — Progress Notes (Signed)
Utilization review completed.  

## 2015-07-21 NOTE — Progress Notes (Signed)
Quiet, not fidgeting nor grimacing. When remarked he looks a lot more comfortable, he states" It has eased off".

## 2015-07-21 NOTE — Care Management Note (Signed)
Case Management Note  Patient Details  Name: RADHAMES ESMAILI MRN: [id number] Date of Birth: 1940/12/01  Subjective/Objective:  75 yr old male s/p left total hip arthroplasty.                  Action/Plan: Case manager spoke with patient and family at bedside concerning home health and DME needs at discharge. Choice for home health agency was offered. Patient was preoperatively setup with [facility] but they want to be serviced by [facility]. Case manager called referral to [facility], [place] Specialist. Mrs. [place] requested that they have the same therapist they had previously, could only remember is first, name, Antony Haste. CM gave this request to [place] as well.    Expected Discharge Date:   07/22/15               Expected Discharge Plan:  [place]  In-House Referral:  NA  Discharge planning Services  CM Consult  Post Acute Care Choice:  Home Health Choice offered to:     DME Arranged:  N/A DME Agency:  NA  HH Arranged:  PT [place] Agency:  [place]  Status of Service:  Completed, signed off  Medicare Important Message Given:    Date Medicare IM Given:    Medicare IM give by:    Date Additional Medicare IM Given:    Additional Medicare Important Message give by:     If discussed at [place] of Stay Meetings, dates discussed:    Additional Comments:  Ninfa Meeker, RN 07/21/2015, 4:36 PM

## 2015-07-21 NOTE — Op Note (Signed)
NAMEANGELINA, [name]               ACCOUNT NO.:  [id number]  MEDICAL RECORD NO.:  [id number]  LOCATION:  [facility]                         FACILITY:  [facility]  PHYSICIAN:  Lind Guest. 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  PROCEDURE:  Left total hip arthroplasty through direct anterior approach.  IMPLANTS:  DePuy Sector Gription acetabular component size 60, size 36 +4 neutral polyethylene liner, size 13 Corail femoral component with standard offset, size 36 +5 ceramic hip ball.  SURGEON:  Lind Guest. Ninfa Linden, M.D.  ASSISTANT:  Erskine Emery, PA-C.  ANESTHESIA:  General.  ANTIBIOTICS:  2 g IV Ancef.  BLOOD LOSS:  Less than 500 mL.  COMPLICATIONS:  None.  INDICATIONS:  Mr. [name] is a very pleasant 75 year old gentleman with severe debilitating arthritis involving his left hip.  He has had a previous total hip arthroplasty on the right side through a posterior approach.  His right leg is much longer than his operative left leg.  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  He does wish for a total hip arthroplasty at this point through a direct anterior approach. His x-ray shows severe changes of the cartilage throughout his left hip ball and socket.  This periarticular osteophyte is sclerotic and cystic changes and again protrusio.  The left leg is certainly short than the right.  He understands the risk of acute blood loss anemia, nerve and vessel injury, fracture, infection, dislocation, and DVT.  He understands our goals are decreased pain, improved mobility, and overall improved quality of life.  PROCEDURE DESCRIPTION:  After informed consent was obtained,  appropriate left hip was marked, he was brought to the operating room.  General anesthesia was obtained while he was on the stretcher.  He was next placed supine on the Hana fracture table with perineal post in place and both legs in inline skeletal traction boot but no traction applied.  The left operative hip was then prepped and draped with DuraPrep and sterile drapes.  A time-out was called and he was identified as the correct patient and the correct left hip.  We then made an incision inferior and posterior to the anterior-superior iliac spine and carried this obliquely down the leg.  We dissected down the tensor fascia lata muscle.  The tensor fascia was then divided longitudinally so we could proceed with a direct anterior approach to the hip.  We identified and cauterized the lateral femoral circumflex vessels and then identified the hip capsule.  We opened up the hip capsule in L-type format.  We found a large joint effusion and significant periarticular osteophytes. We then made our femoral neck cut with an oscillating saw proximal to the lesser trochanter and completed this with an osteotome.  We placed a corkscrew guide in the femoral head and removed the femoral head in its entirety and found it to be completely devoid of cartilage.  We then cleaned the acetabular  remnants of the acetabular labrum and other debris as well as periarticular osteophytes.  We placed a bent Hohmann over the medial acetabular rim and then began reaming from a size 42 reamer up to a size 60 with all reamers under direct visualization and the last 2 reamers under direct fluoroscopy, so we could obtain our depth of reaming, our inclination, and anteversion.  Once we were pleased with that, we placed the real DePuy Sector Gription acetabular component with size 60 and a 36 +4 polyethylene liner.  Attention was then turned to the femur.  With the leg externally rotated to 100 degrees, extended and  adducted, we were able to use a Mueller retractor medially and a Hohmann retractor behind the greater trochanter.  We released the lateral joint capsule, used a box cutting osteotome to enter the femoral canal and a rongeur to lateralize.  We then began our broaching from a size 8 broach up to a size 13.  With the 13, we trialed a standard offset femoral neck and a 36 +1.5 hip ball.  We brought the leg back over and up with traction and internal rotation reducing the pelvis, and we felt like we could get a little bit more offset and leg lengths but he was stable.  We then dislocated the hip and removed the trial components.  We placed the real size 13 Corail femoral component without difficulty and then the real 36 +5 ceramic hip ball.  We reduced this in the pelvis and we were pleased with leg length, offset, and range of motion.  We then irrigated the soft tissue with normal saline solution.  We closed the joint capsule with interrupted #1 Ethibond suture followed by running #1 Vicryl in the tensor fascia, 0 Vicryl in the deep tissue, 2-0 Vicryl in subcutaneous tissue, 4-0 Monocryl subcuticular stitch, and Steri-Strips on the skin.  An Aquacel dressing was applied.  He was taken off the Hana table, awakened, extubated and taken to the recovery room in stable condition.  All final counts were correct.  There were no complications noted.  Of note, Erskine Emery, PA- C assisted in the entire case.  His assistance was crucial for facilitating all aspects of this case.     Lind Guest. Ninfa Linden, M.D.     CYB/MEDQ  D:  07/21/2015  T:  07/21/2015  Job:  [id number]

## 2015-07-21 NOTE — Transfer of Care (Signed)
Immediate Anesthesia Transfer of Care Note  Patient: [place]  Procedure(s) Performed: Procedure(s): LEFT TOTAL HIP ARTHROPLASTY ANTERIOR APPROACH (Left)  Patient Location: PACU  Anesthesia Type:General  Level of Consciousness: awake and patient cooperative  Airway & Oxygen Therapy: Patient Spontanous Breathing and Patient connected to face mask oxygen  Post-op Assessment: Report given to RN, Post -op Vital signs reviewed and stable and Patient moving all extremities  Post vital signs: Reviewed and stable  Last Vitals:  Filed Vitals:   07/21/15 0821 07/21/15 1321  BP: 127/60 149/76  Pulse: 58   Temp: 36.1 C 36.6 C  Resp: 18 24    Complications: No apparent anesthesia complications

## 2015-07-21 NOTE — Anesthesia Preprocedure Evaluation (Signed)
Anesthesia Evaluation  Patient identified by MRN, date of birth, ID band Patient awake    Reviewed: Allergy & Precautions, H&P , NPO status , Patient's Chart, lab work & pertinent test results  History of Anesthesia Complications (+) PONV  Airway Mallampati: II  TM Distance: >3 FB Neck ROM: full    Dental no notable dental hx. (+) Dental Advisory Given, Teeth Intact   Pulmonary neg pulmonary ROS, former smoker,    Pulmonary exam normal breath sounds clear to auscultation       Cardiovascular Exercise Tolerance: Good hypertension, Pt. on home beta blockers negative cardio ROS Normal cardiovascular exam Rhythm:regular Rate:Normal  Irregular heart beat   Neuro/Psych negative neurological ROS  negative psych ROS   GI/Hepatic negative GI ROS, Neg liver ROS,   Endo/Other  negative endocrine ROS  Renal/GU negative Renal ROS  negative genitourinary   Musculoskeletal   Abdominal   Peds  Hematology negative hematology ROS (+)   Anesthesia Other Findings   Reproductive/Obstetrics negative OB ROS                             Anesthesia Physical Anesthesia Plan  ASA: II  Anesthesia Plan: General   Post-op Pain Management:    Induction: Intravenous  Airway Management Planned: Oral ETT  Additional Equipment:   Intra-op Plan:   Post-operative Plan: Extubation in OR  Informed Consent: I have reviewed the patients History and Physical, chart, labs and discussed the procedure including the risks, benefits and alternatives for the proposed anesthesia with the patient or authorized representative who has indicated his/her understanding and acceptance.   Dental Advisory Given  Plan Discussed with: CRNA and Surgeon  Anesthesia Plan Comments:         Anesthesia Quick Evaluation

## 2015-07-21 NOTE — Progress Notes (Signed)
Additional orders rec'd from Dr Landry Dyke for pain meds after current med regimen as pt still moderately uncomfortable

## 2015-07-21 NOTE — Evaluation (Signed)
Physical Therapy Evaluation Patient Details Name: [name] MRN: [id number] DOB: [birth year] Today's Date: 07/21/2015   History of Present Illness  Pt is a 75 y/o M s/p Lt THA.  Pt's PMH includes dysrhythmia, skin cancer, jaundice, Rt THA.    Clinical Impression  Pt is s/p Lt THA resulting in the deficits listed below (see PT Problem List). [name] currently requires min guard assist for sit<>stand transfers and ambulation using RW.  He will have 24/7 assist available from his wife and daughter at d/c. Pt will benefit from skilled PT to increase their independence and safety with mobility to allow discharge to the venue listed below.     Follow Up Recommendations Home health PT;Supervision for mobility/OOB    Equipment Recommendations  None recommended by PT    Recommendations for Other Services OT consult     Precautions / Restrictions Precautions Precautions: Fall Precaution Comments: No hip precaution order.  Direct anterior? Op note mentions use of Hana table Restrictions Weight Bearing Restrictions: Yes LLE Weight Bearing: Weight bearing as tolerated      Mobility  Bed Mobility Overal bed mobility: Needs Assistance Bed Mobility: Supine to Sit     Supine to sit: Min guard;HOB elevated     General bed mobility comments: HOB and use of bed rail w/ increased time and cues for sequencing  Transfers Overall transfer level: Needs assistance Equipment used: Rolling walker (2 wheeled) Transfers: Sit to/from Stand Sit to Stand: Min guard         General transfer comment: Cues for hand placement.  Pt slow to stand.  Ambulation/Gait Ambulation/Gait assistance: Min guard Ambulation Distance (Feet): 90 Feet Assistive device: Rolling walker (2 wheeled) Gait Pattern/deviations: Step-to pattern;Decreased stride length;Decreased weight shift to left;Antalgic;Trunk flexed   Gait velocity interpretation: Below normal speed for age/gender General Gait Details: Cues to  look upright and for upright posture.  Pt's Bil UEs fatiguing toward end of ambulation.    Stairs            Wheelchair Mobility    Modified Rankin (Stroke Patients Only)       Balance Overall balance assessment: Needs assistance Sitting-balance support: Feet supported;No upper extremity supported Sitting balance-Leahy Scale: Good     Standing balance support: Bilateral upper extremity supported;During functional activity Standing balance-Leahy Scale: Poor Standing balance comment: Relies on RW for support                             Pertinent Vitals/Pain Pain Assessment: 0-10 Pain Score: 5  (3/10 after ambulation) Pain Location: Lt hip and Lt knee Pain Descriptors / Indicators: Shooting;Aching;Grimacing Pain Intervention(s): Limited activity within patient's tolerance;Monitored during session;Repositioned;RN gave pain meds during session;Ice applied    Home Living Family/patient expects to be discharged to:: Private residence Living Arrangements: Spouse/significant other Available Help at Discharge: Family;Available 24 hours/day (wife and daughter) Type of Home: House Home Access: Stairs to enter Entrance Stairs-Rails: None Entrance Stairs-Number of Steps: 2 Home Layout: Two level;Able to live on main level with bedroom/bathroom;Full bath on main level Home Equipment: Walker - 2 wheels;Bedside commode;Cane - single point Additional Comments: Pt's bedroom is on the second floor and wife's is on the second.  Per family they will be able to accommodate pt staying on the first floor at d/c.    Prior Function Level of Independence: Independent with assistive device(s)         Comments: Just recently began using a  cane (over the past week or two)     Hand Dominance        Extremity/Trunk Assessment   Upper Extremity Assessment: Defer to OT evaluation           Lower Extremity Assessment: LLE deficits/detail   LLE Deficits / Details: limited  ROM and strength as expected s/p Lt THA     Communication   Communication: No difficulties  Cognition Arousal/Alertness: Awake/alert Behavior During Therapy: WFL for tasks assessed/performed Overall Cognitive Status: Within Functional Limits for tasks assessed                      General Comments      Exercises General Exercises - Lower Extremity Ankle Circles/Pumps: AROM;Both;Supine;20 reps Gluteal Sets: Strengthening;Both;10 reps;Supine Long Arc Quad: AROM;Both;10 reps;Seated      Assessment/Plan    PT Assessment Patient needs continued PT services  PT Diagnosis Difficulty walking;Abnormality of gait;Acute pain   PT Problem List Decreased strength;Decreased range of motion;Decreased activity tolerance;Decreased balance;Decreased mobility;Decreased knowledge of use of DME;Decreased safety awareness;Pain  PT Treatment Interventions DME instruction;Gait training;Stair training;Functional mobility training;Therapeutic activities;Therapeutic exercise;Balance training;Patient/family education;Neuromuscular re-education;Modalities   PT Goals (Current goals can be found in the Care Plan section) Acute Rehab PT Goals Patient Stated Goal: to go home PT Goal Formulation: With patient/family Time For Goal Achievement: 07/28/15 Potential to Achieve Goals: Good    Frequency 7X/week   Barriers to discharge Inaccessible home environment steps to enter home    Co-evaluation               End of Session Equipment Utilized During Treatment: Gait belt Activity Tolerance: Patient tolerated treatment well;No increased pain;Patient limited by fatigue Patient left: in chair;with call bell/phone within reach;with chair alarm set;with family/visitor present Nurse Communication: Mobility status;Weight bearing status         Time: [id number] PT Time Calculation (min) (ACUTE ONLY): 28 min   Charges:   PT Evaluation $PT Eval Moderate Complexity: 1 Procedure PT  Treatments $Gait Training: 8-22 mins   PT G Codes:        [name] PT, DPT  Pager: [phone] Phone: [phone] 07/21/2015, 4:45 PM

## 2015-07-21 NOTE — Anesthesia Procedure Notes (Signed)
Procedure Name: Intubation Date/Time: 07/21/2015 11:38 AM Performed by: Williemae Area B Pre-anesthesia Checklist: Patient identified, Emergency Drugs available, Suction available and Patient being monitored Patient Re-evaluated:Patient Re-evaluated prior to inductionOxygen Delivery Method: Circle system utilized Preoxygenation: Pre-oxygenation with 100% oxygen Intubation Type: IV induction Ventilation: Mask ventilation without difficulty Laryngoscope Size: Mac and 4 Grade View: Grade II Tube type: Oral Tube size: 7.5 mm Number of attempts: 1 Airway Equipment and Method: Stylet Placement Confirmation: ETT inserted through vocal cords under direct vision,  positive ETCO2 and breath sounds checked- equal and bilateral Secured at: 22 (cm at teeth) cm Tube secured with: Tape Dental Injury: Teeth and Oropharynx as per pre-operative assessment

## 2015-07-21 NOTE — Brief Op Note (Signed)
07/21/2015  12:56 PM  PATIENT:  [name]  75 y.o. male  PRE-OPERATIVE DIAGNOSIS:  severe osteoarthritis left hip  POST-OPERATIVE DIAGNOSIS:  severe osteoarthritis left hip  PROCEDURE:  Procedure(s): LEFT TOTAL HIP ARTHROPLASTY ANTERIOR APPROACH (Left)  SURGEON:  Surgeon(s) and Role:    * Mcarthur Rossetti, MD - Primary  PHYSICIAN ASSISTANT: Benita Stabile, PA-C  ANESTHESIA:   general  EBL:  Total I/O In: 1000 [I.V.:1000] Out: 150 [Blood:150]  COUNTS:  YES  DICTATION: .Other Dictation: Dictation Number [id number]  PLAN OF CARE: Admit to inpatient   PATIENT DISPOSITION:  PACU - hemodynamically stable.   Delay start of Pharmacological VTE agent (>24hrs) due to surgical blood loss or risk of bleeding: no

## 2015-07-21 NOTE — H&P (Signed)
TOTAL HIP ADMISSION H&P  Patient is admitted for left total hip arthroplasty.  Subjective:  Chief Complaint: left hip pain  HPI: [name], 75 y.o. male, has a history of pain and functional disability in the left hip(s) due to arthritis and patient has failed non-surgical conservative treatments for greater than 12 weeks to include NSAID's and/or analgesics, corticosteriod injections, flexibility and strengthening excercises, supervised PT with diminished ADL's post treatment, use of assistive devices, weight reduction as appropriate and activity modification.  Onset of symptoms was gradual starting 5 years ago with gradually worsening course since that time.The patient noted no past surgery on the left hip(s).  Patient currently rates pain in the left hip at 9 out of 10 with activity. Patient has night pain, worsening of pain with activity and weight bearing, trendelenberg gait, pain that interfers with activities of daily living, pain with passive range of motion and crepitus. Patient has evidence of subchondral cysts, subchondral sclerosis, periarticular osteophytes and joint space narrowing by imaging studies. This condition presents safety issues increasing the risk of falls.  There is no current active infection.  Patient Active Problem List   Diagnosis Date Noted  . Osteoarthritis of left hip 07/21/2015   Past Medical History  Diagnosis Date  . PONV (postoperative nausea and vomiting)   . Hypertension   . Dysrhythmia     IRREG HEARTBEAT  . Arthritis   . Cancer ([place])   . 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 Jaundice     AGE 64    Past Surgical History  Procedure Laterality Date  . Cardiac catheterization    . Coronary angioplasty      [facility]  . Hip arthroplasty      RIGHT HIP  2012  . Colon surgery      04/2012 [facility]  . Cholecystectomy      2002 [place]    Prescriptions prior to admission  Medication Sig Dispense  Refill Last Dose  . aspirin EC 81 MG tablet Take 81 mg by mouth daily.   07/15/2015  . clopidogrel (PLAVIX) 75 MG tablet Take 75 mg by mouth daily.   07/15/2015  . HYDROcodone-acetaminophen (NORCO/VICODIN) 5-325 MG tablet Take 1 tablet by mouth every 8 (eight) hours as needed.  0 07/21/2015 at 0700  . loratadine (CLARITIN) 10 MG tablet Take 10 mg by mouth daily.   07/20/2015 at Unknown time  . metoprolol (LOPRESSOR) 50 MG tablet Take 25 mg by mouth at bedtime.   07/20/2015 at 2200  . Multiple Vitamin (MULTIVITAMIN WITH MINERALS) TABS tablet Take 1 tablet by mouth daily.   07/20/2015 at Unknown time  . simvastatin (ZOCOR) 40 MG tablet Take 40 mg by mouth every evening.   07/20/2015 at Unknown time  . tamsulosin (FLOMAX) 0.4 MG CAPS capsule Take 0.4 mg by mouth daily.   07/20/2015 at Unknown time  . valsartan-hydrochlorothiazide (DIOVAN-HCT) 320-12.5 MG tablet Take 1 tablet by mouth daily.  0 07/20/2015 at Unknown time  . nitroGLYCERIN (NITROSTAT) 0.4 MG SL tablet Place 0.4 mg under the tongue every 5 (five) minutes as needed for chest pain.    More than a month at Unknown time   No Known Allergies  Social History  Substance Use Topics  . Smoking status: Former Research scientist (life sciences)  . Smokeless tobacco: Never Used  . Alcohol Use: No    History reviewed. No pertinent family history.   Review of Systems  Musculoskeletal: Positive  for joint pain.  All other systems reviewed and are negative.   Objective:  Physical Exam  Constitutional: He is oriented to person, place, and time. He appears well-developed and well-nourished.  HENT:  Head: Normocephalic and atraumatic.  Eyes: EOM are normal. Pupils are equal, round, and reactive to light.  Neck: Normal range of motion. Neck supple.  Cardiovascular: Normal rate and regular rhythm.   Respiratory: Effort normal and breath sounds normal.  GI: Soft. Bowel sounds are normal.  Musculoskeletal:       Left hip: He exhibits decreased range of motion, decreased strength,  tenderness and bony tenderness.  Neurological: He is alert and oriented to person, place, and time.  Skin: Skin is warm and dry.  Psychiatric: He has a normal mood and affect.    Vital signs in last 24 hours: Temp:  [97 F (36.1 C)] 97 F (36.1 C) (03/02 0821) Pulse Rate:  [58] 58 (03/02 0821) Resp:  [18] 18 (03/02 0821) BP: (127)/(60) 127/60 mmHg (03/02 0821) SpO2:  [98 %] 98 % (03/02 0821)  Labs:   Estimated body mass index is 29.49 kg/(m^2) as calculated from the following:   Height as of 07/05/15: 6' (1.829 m).   Weight as of 07/05/15: 98.657 kg (217 lb 8 oz).   Imaging Review Plain radiographs demonstrate severe degenerative joint disease of the left hip(s). The bone quality appears to be good for age and reported activity level.  Assessment/Plan:  End stage arthritis, left hip(s)  The patient history, physical examination, clinical judgement of the provider and imaging studies are consistent with end stage degenerative joint disease of the left hip(s) and total hip arthroplasty is deemed medically necessary. The treatment options including medical management, injection therapy, arthroscopy and arthroplasty were discussed at length. The risks and benefits of total hip arthroplasty were presented and reviewed. The risks due to aseptic loosening, infection, stiffness, dislocation/subluxation,  thromboembolic complications and other imponderables were discussed.  The patient acknowledged the explanation, agreed to proceed with the plan and consent was signed. Patient is being admitted for inpatient treatment for surgery, pain control, PT, OT, prophylactic antibiotics, VTE prophylaxis, progressive ambulation and ADL's and discharge planning.The patient is planning to be discharged home with home health services

## 2015-07-22 ENCOUNTER — Encounter (HOSPITAL_COMMUNITY): Payer: Self-pay | Admitting: Orthopaedic Surgery

## 2015-07-22 LAB — CBC
HEMATOCRIT: 33 % — AB (ref 39.0–52.0)
HEMOGLOBIN: 11.3 g/dL — AB (ref 13.0–17.0)
MCH: 31.8 pg (ref 26.0–34.0)
MCHC: 34.2 g/dL (ref 30.0–36.0)
MCV: 93 fL (ref 78.0–100.0)
Platelets: 177 10*3/uL (ref 150–400)
RBC: 3.55 MIL/uL — ABNORMAL LOW (ref 4.22–5.81)
RDW: 13.4 % (ref 11.5–15.5)
WBC: 8.5 10*3/uL (ref 4.0–10.5)

## 2015-07-22 LAB — BASIC METABOLIC PANEL
ANION GAP: 10 (ref 5–15)
BUN: 29 mg/dL — AB (ref 6–20)
CALCIUM: 8.2 mg/dL — AB (ref 8.9–10.3)
CHLORIDE: 103 mmol/L (ref 101–111)
CO2: 24 mmol/L (ref 22–32)
CREATININE: 2.19 mg/dL — AB (ref 0.61–1.24)
GFR calc non Af Amer: 28 mL/min — ABNORMAL LOW (ref >60)
GFR, EST AFRICAN AMERICAN: 33 mL/min — AB (ref >60)
Glucose, Bld: 138 mg/dL — ABNORMAL HIGH (ref 65–99)
POTASSIUM: 4.5 mmol/L (ref 3.5–5.1)
SODIUM: 137 mmol/L (ref 135–145)

## 2015-07-22 MED ORDER — TIZANIDINE HCL 4 MG PO TABS: 4.0000 mg | ORAL_TABLET | Freq: Three times a day (TID) | ORAL | Status: DC | PRN

## 2015-07-22 MED ORDER — ASPIRIN 325 MG PO TBEC: 325.0000 mg | DELAYED_RELEASE_TABLET | Freq: Every day | ORAL | Status: DC

## 2015-07-22 MED ORDER — KETOROLAC TROMETHAMINE 15 MG/ML IJ SOLN
15.0000 mg | Freq: Four times a day (QID) | INTRAMUSCULAR | Status: AC
  Administered 2015-07-22 (×2): 15 mg via INTRAVENOUS
  Filled 2015-07-22 (×3): qty 1

## 2015-07-22 MED ORDER — OXYCODONE HCL 5 MG PO TABS: 5.0000 mg | ORAL_TABLET | ORAL | Status: DC | PRN

## 2015-07-22 NOTE — Progress Notes (Signed)
Occupational Therapy Evaluation Patient Details Name: [name] MRN: [id number] DOB: Feb 03, 1941 Today's Date: 07/22/2015    History of Present Illness Pt is a 75 y/o M s/p Lt THA.  Pt's PMH includes dysrhythmia, skin cancer, jaundice, Rt THA.     Clinical Impression   PTA, pt was independent with ADLs and mobility. Pt currently requires min assist for LB ADLs and min guard assist for mobility. Pt was very lethargic this session and pt's wife reports he has been like this most of the day. Began education on compensatory strategies and fall prevention, but pt did not seem to be retaining much information due to lethargy. Pt plans to d/c home with 24/7 assistance from his wife. Pt will benefit from continued acute OT to increase independence and safety with ADLs and mobility to allow for safe discharge home. No OT follow up or DME recommended at this time.    Follow Up Recommendations  No OT follow up;Supervision/Assistance - 24 hour    Equipment Recommendations  None recommended by OT    Recommendations for Other Services       Precautions / Restrictions Precautions Precautions: Fall Precaution Comments: No hip precaution order.  Direct anterior? Op note mentions use of Hana table Restrictions Weight Bearing Restrictions: Yes LLE Weight Bearing: Weight bearing as tolerated      Mobility Bed Mobility               General bed mobility comments: Pt up in chair on OT arrival  Transfers Overall transfer level: Needs assistance Equipment used: Rolling walker (2 wheeled) Transfers: Sit to/from Stand Sit to Stand: Min guard         General transfer comment: Min guard for safety and balance. Cues for safe hand placement and to put L heel down during ambulation.    Balance Overall balance assessment: Needs assistance Sitting-balance support: No upper extremity supported;Feet supported Sitting balance-Leahy Scale: Good     Standing balance support: Bilateral  upper extremity supported;During functional activity Standing balance-Leahy Scale: Fair                              ADL Overall ADL's : Needs assistance/impaired     Grooming: Wash/dry hands;Min guard;Standing           Upper Body Dressing : Set up;Sitting   Lower Body Dressing: Minimal assistance;Sit to/from stand   Toilet Transfer: Min guard;Ambulation;BSC;RW Toilet Transfer Details (indicate cue type and reason): BSC over toilet, cues to feel BSC on back of legs before reaching back to sit Toileting- Clothing Manipulation and Hygiene: Min guard;Sit to/from stand       Functional mobility during ADLs: Min guard;Rolling walker General ADL Comments: Began education compensatory strategies for ADLs and fall prevention. Pt very lethargic and felt he was not retaining any information given by therapist. Pt's wife present for OT eval and reported pt has been extremely lethargic all day since taking pain medication.     Vision Vision Assessment?: No apparent visual deficits   Perception     Praxis      Pertinent Vitals/Pain Pain Assessment: 0-10 Pain Score: 2  Pain Location: L hip Pain Descriptors / Indicators: Sore Pain Intervention(s): Limited activity within patient's tolerance;Monitored during session;Repositioned     Hand Dominance Right   Extremity/Trunk Assessment Upper Extremity Assessment Upper Extremity Assessment: Overall WFL for tasks assessed   Lower Extremity Assessment Lower Extremity Assessment: LLE deficits/detail LLE Deficits /  Details: limited ROM and strength as expected s/p Lt THA LLE: Unable to fully assess due to pain   Cervical / Trunk Assessment Cervical / Trunk Assessment: Kyphotic   Communication Communication Communication: No difficulties   Cognition Arousal/Alertness: Lethargic;Suspect due to medications Behavior During Therapy: [facility] for tasks assessed/performed Overall Cognitive Status: Within Functional Limits for  tasks assessed                     General Comments       Exercises       Shoulder Instructions      Home Living Family/patient expects to be discharged to:: Private residence Living Arrangements: Spouse/significant other Available Help at Discharge: Family;Available 24 hours/day (wife and daughter) Type of Home: House Home Access: Stairs to enter [employer] of Steps: 2 Entrance Stairs-Rails: None Home Layout: Two level;Able to live on main level with bedroom/bathroom;Full bath on main level Alternate Level Stairs-Number of Steps: flight Alternate Level Stairs-Rails: Left;Right;Can reach both Bathroom Shower/Tub: Walk-in Hydrologist: Standard     Home Equipment: Environmental consultant - 2 wheels;Bedside commode;Cane - single point;Hand held shower head;Grab bars - tub/shower   Additional Comments: Pt's bedroom is on the second floor and wife's is on the second.  Per family they will be able to accommodate pt staying on the first floor at d/c.      Prior Functioning/Environment Level of Independence: Independent with assistive device(s)        Comments: Just recently began using a cane (over the past week or two)    OT Diagnosis: Acute pain   OT Problem List: Decreased strength;Decreased range of motion;Decreased activity tolerance;Impaired balance (sitting and/or standing);Decreased coordination;Decreased knowledge of use of DME or AE;Decreased safety awareness;Decreased knowledge of precautions;Pain   OT Treatment/Interventions: Self-care/ADL training;Therapeutic exercise;Energy conservation;DME and/or AE instruction;Therapeutic activities;Patient/family education;Balance training    OT Goals(Current goals can be found in the care plan section) Acute Rehab OT Goals Patient Stated Goal: to go home OT Goal Formulation: With patient Time For Goal Achievement: 08/05/15 Potential to Achieve Goals: Good ADL Goals Pt Will Perform Lower Body  Bathing: with supervision;sit to/from stand Pt Will Perform Lower Body Dressing: with supervision;sit to/from stand Pt Will Transfer to Toilet: with supervision;bedside commode;ambulating (BSC over toilet) Pt Will Perform Toileting - Clothing Manipulation and hygiene: with supervision;sit to/from stand Pt Will Perform Tub/Shower Transfer: Shower transfer;with supervision;ambulating;3 in 1;rolling walker  OT Frequency: Min 2X/week   Barriers to D/C:            Co-evaluation              End of Session Equipment Utilized During Treatment: Gait belt;Rolling walker Nurse Communication: Mobility status  Activity Tolerance: Patient tolerated treatment well Patient left: in chair;with call bell/phone within reach;with family/visitor present   Time: [id number] OT Time Calculation (min): 23 min Charges:  OT General Charges $OT Visit: 1 Procedure OT Evaluation $OT Eval Moderate Complexity: 1 Procedure OT Treatments $Self Care/Home Management : 8-22 mins G-Codes:    Redmond Baseman, OTR/L Pager: [phone] 07/22/2015, 4:43 PM

## 2015-07-22 NOTE — Progress Notes (Signed)
Physical Therapy Treatment Patient Details Name: [name] MRN: [id number] DOB: Jun 12, 1940 Today's Date: 07/22/2015    History of Present Illness Pt is a 75 y/o M s/p Lt THA.  Pt's PMH includes dysrhythmia, skin cancer, jaundice, Rt THA.      PT Comments    Patient seen for progression of mobility. Tolerated increased activity and ambulation well. Improved gait and cadence. Will follow up this afternoon for further mobility and education.  Follow Up Recommendations  Home health PT;Supervision for mobility/OOB     Equipment Recommendations  None recommended by PT    Recommendations for Other Services OT consult     Precautions / Restrictions Precautions Precautions: Fall Precaution Comments: No hip precaution order.  Direct anterior? Op note mentions use of Hana table Restrictions Weight Bearing Restrictions: Yes LLE Weight Bearing: Weight bearing as tolerated    Mobility  Bed Mobility Overal bed mobility: Needs Assistance Bed Mobility: Supine to Sit     Supine to sit: Min guard;HOB elevated     General bed mobility comments: HOB and use of bed rail w/ increased time and cues for sequencing  Transfers Overall transfer level: Needs assistance Equipment used: Rolling walker (2 wheeled)   Sit to Stand: Min guard         General transfer comment: Cues for hand placement.  Pt slow to stand.  Ambulation/Gait Ambulation/Gait assistance: Supervision Ambulation Distance (Feet): 180 Feet Assistive device: Rolling walker (2 wheeled) Gait Pattern/deviations: Step-through pattern;Decreased stride length;Antalgic;Trunk flexed Gait velocity: decreased Gait velocity interpretation: Below normal speed for age/gender General Gait Details: Cues to look upright and for upright posture.  Pt's Bil UEs fatiguing toward end of ambulation.     Stairs            Wheelchair Mobility    Modified Rankin (Stroke Patients Only)       Balance   Sitting-balance  support: Feet supported Sitting balance-Leahy Scale: Good     Standing balance support: Bilateral upper extremity supported Standing balance-Leahy Scale: Fair Standing balance comment: reliance on UE support                    Cognition Arousal/Alertness: Awake/alert Behavior During Therapy: WFL for tasks assessed/performed Overall Cognitive Status: Within Functional Limits for tasks assessed                      Exercises      General Comments        Pertinent Vitals/Pain Pain Assessment: 0-10 Pain Score: 6  Pain Location: left hip Pain Descriptors / Indicators: Aching;Discomfort;Grimacing;Guarding    Home Living                      Prior Function            PT Goals (current goals can now be found in the care plan section) Acute Rehab PT Goals Patient Stated Goal: to go home PT Goal Formulation: With patient/family Time For Goal Achievement: 07/28/15 Potential to Achieve Goals: Good Progress towards PT goals: Progressing toward goals    Frequency  7X/week    PT Plan Current plan remains appropriate    Co-evaluation             End of Session Equipment Utilized During Treatment: Gait belt Activity Tolerance: Patient tolerated treatment well;No increased pain;Patient limited by fatigue Patient left: in chair;with call bell/phone within reach;with chair alarm set;with family/visitor present     Time: [id number]  PT Time Calculation (min) (ACUTE ONLY): 20 min  Charges:  $Gait Training: 8-22 mins                    G CodesDuncan Dull Jul 23, 2015, 11:09 AM Alben Deeds, PT DPT  [phone]

## 2015-07-22 NOTE — Progress Notes (Signed)
Subjective: 1 Day Post-Op Procedure(s) (LRB): LEFT TOTAL HIP ARTHROPLASTY ANTERIOR APPROACH (Left) Patient reports pain as moderate.    Objective: Vital signs in last 24 hours: Temp:  [97 F (36.1 C)-98.2 F (36.8 C)] 98.2 F (36.8 C) (03/03 0600) Pulse Rate:  [58-76] 67 (03/03 0600) Resp:  [18-24] 18 (03/03 0600) BP: (111-149)/(60-76) 111/61 mmHg (03/03 0600) SpO2:  [93 %-98 %] 96 % (03/03 0600)  Intake/Output from previous day: 03/02 0701 - 03/03 0700 In: 3062.5 [P.O.:490; I.V.:2472.5; IV Piggyback:100] Out: 150 [Blood:150] Intake/Output this shift: Total I/O In: 1072.5 [I.V.:972.5; IV Piggyback:100] Out: -    Recent Labs  07/19/15 1242  HGB 13.9    Recent Labs  07/19/15 1242  WBC 8.6  RBC 4.43  HCT 41.2  PLT 198    Recent Labs  07/19/15 1242  NA 141  K 4.5  CL 107  CO2 23  BUN 17  CREATININE 1.37*  GLUCOSE 97  CALCIUM 9.3   No results for input(s): LABPT, INR in the last 72 hours.  Sensation intact distally Intact pulses distally Dorsiflexion/Plantar flexion intact Incision: dressing C/D/I  Assessment/Plan: 1 Day Post-Op Procedure(s) (LRB): LEFT TOTAL HIP ARTHROPLASTY ANTERIOR APPROACH (Left) Up with therapy  BLACKMAN,CHRISTOPHER Y 07/22/2015, 6:58 AM

## 2015-07-22 NOTE — Discharge Instructions (Signed)

## 2015-07-22 NOTE — Progress Notes (Signed)
Patient ID: [name], male   DOB: Jan 11, 1941, 75 y.o.   MRN: [id number] Doing well.  Vitals stable.  Can go home Saturday.

## 2015-07-22 NOTE — Progress Notes (Signed)
Physical Therapy Treatment Patient Details Name: [name] MRN: [id number] DOB: 09-10-1940 Today's Date: 07/22/2015    History of Present Illness Pt is a 75 y/o M s/p Lt THA.  Pt's PMH includes dysrhythmia, skin cancer, jaundice, Rt THA.      PT Comments    Continues to make good progress. Ambulating up to 200 feet, safely completed stair training, and feels confident in his abilities. A bit flat and lethargic today but able and willing to participate. Wife present and supportive throughout therapy session. Adequate for d/c from PT standpoint when medically ready.  Follow Up Recommendations  Home health PT;Supervision for mobility/OOB     Equipment Recommendations  None recommended by PT    Recommendations for Other Services OT consult     Precautions / Restrictions Precautions Precautions: Fall Precaution Comments: No hip precaution order.  Direct anterior? Op note mentions use of Hana table Restrictions Weight Bearing Restrictions: Yes LLE Weight Bearing: Weight bearing as tolerated    Mobility  Bed Mobility Overal bed mobility: Needs Assistance Bed Mobility: Sit to Supine       Sit to supine: Min assist   General bed mobility comments: Min assist for LLE support into bed. VC for RLE to support heel of LLE into bed.  Transfers Overall transfer level: Needs assistance Equipment used: Rolling walker (2 wheeled) Transfers: Sit to/from Stand Sit to Stand: Min guard         General transfer comment: Min guard for safety. VC for hand placement. Slow to rise but stable once upright with hands on RW for support.  Ambulation/Gait Ambulation/Gait assistance: Supervision Ambulation Distance (Feet): 200 Feet Assistive device: Rolling walker (2 wheeled) Gait Pattern/deviations: Step-to pattern;Step-through pattern;Decreased step length - right;Decreased stance time - left;Antalgic;Trunk flexed Gait velocity: decreased Gait velocity interpretation: Below normal  speed for age/gender General Gait Details: Cues for upright posture and forward gaze. No buckling noted although mildly antalgic. Required several standing rest breaks to complete distance. Focused on symmetry of gait with step-though pattern.   Stairs Stairs: Yes Stairs assistance: Min guard Stair Management: No rails;Step to pattern;Backwards;With walker Number of Stairs: 1 (x2) General stair comments: Practiced posterior approach with RW, VC for sequencing. Performed x2 without physical assist. Wife observed. Both state they feel confident with this task.  Wheelchair Mobility    Modified Rankin (Stroke Patients Only)       Balance Overall balance assessment: Needs assistance Sitting-balance support: No upper extremity supported;Feet supported Sitting balance-Leahy Scale: Good     Standing balance support: Bilateral upper extremity supported;During functional activity Standing balance-Leahy Scale: Fair                      Cognition Arousal/Alertness: Lethargic;Suspect due to medications Behavior During Therapy: Flat affect Overall Cognitive Status: Within Functional Limits for tasks assessed                      Exercises      General Comments        Pertinent Vitals/Pain Pain Assessment: 0-10 Pain Score: 2  Pain Location: Lt hip Pain Descriptors / Indicators: Sore Pain Intervention(s): Monitored during session;Repositioned    Home Living Family/patient expects to be discharged to:: Private residence Living Arrangements: Spouse/significant other Available Help at Discharge: Family;Available 24 hours/day (wife and daughter) Type of Home: House Home Access: Stairs to enter Entrance Stairs-Rails: None Home Layout: Two level;Able to live on main level with bedroom/bathroom;Full bath on main level Home  Equipment: Gilford Rile - 2 wheels;Bedside commode;Cane - single point;Hand held shower head;Grab bars - tub/shower Additional Comments: Pt's bedroom is  on the second floor and wife's is on the second.  Per family they will be able to accommodate pt staying on the first floor at d/c.    Prior Function Level of Independence: Independent with assistive device(s)      Comments: Just recently began using a cane (over the past week or two)   PT Goals (current goals can now be found in the care plan section) Acute Rehab PT Goals Patient Stated Goal: to go home PT Goal Formulation: With patient/family Time For Goal Achievement: 07/28/15 Potential to Achieve Goals: Good Progress towards PT goals: Progressing toward goals    Frequency  7X/week    PT Plan Current plan remains appropriate    Co-evaluation             End of Session Equipment Utilized During Treatment: Gait belt Activity Tolerance: Patient tolerated treatment well Patient left: with call bell/phone within reach;in bed;with family/visitor present     Time: [id number] PT Time Calculation (min) (ACUTE ONLY): 23 min  Charges:  $Gait Training: 8-22 mins $Therapeutic Activity: 8-22 mins                    G Codes:      Ellouise Newer Jul 26, 2015, 5:12 PM  Camille Bal Coyle, [place]

## 2015-07-23 LAB — CBC
HEMATOCRIT: 27.5 % — AB (ref 39.0–52.0)
Hemoglobin: 9.2 g/dL — ABNORMAL LOW (ref 13.0–17.0)
MCH: 30.8 pg (ref 26.0–34.0)
MCHC: 33.5 g/dL (ref 30.0–36.0)
MCV: 92 fL (ref 78.0–100.0)
PLATELETS: 143 10*3/uL — AB (ref 150–400)
RBC: 2.99 MIL/uL — ABNORMAL LOW (ref 4.22–5.81)
RDW: 13.6 % (ref 11.5–15.5)
WBC: 7.4 10*3/uL (ref 4.0–10.5)

## 2015-07-23 NOTE — Plan of Care (Signed)
Problem: Acute Rehab OT Goals (only OT should resolve) Goal: Pt. Will Perform Lower Body Bathing Outcome: Not Applicable Date Met:  47/09/29 Wife will assist  Goal: Pt. Will Perform Lower Body Dressing Outcome: Not Applicable Date Met:  57/47/34 Wife will assist

## 2015-07-23 NOTE — Progress Notes (Signed)
Patient's blood pressure was 80/42 (manual) this morning. Held patient's antihypertensive medications. Patient felt he was okay to discharge home with wife, and daughter assisting. He had walked in hall with therapy and did not feel unsteady. Had given 15mg  oxycodone at 0800.  Instructed him to take 5mg  oxycodone at home when he felt pain and take a second tablet if that one was not enough. Pain was controlled in the hospital. Also encouraged PO fluid intake. Patient's daughter stated they had a BP monitor at home and would check his BP before resuming his medications. Florian Buff, RN

## 2015-07-23 NOTE — Progress Notes (Signed)
Subjective: 2 Days Post-Op Procedure(s) (LRB): LEFT TOTAL HIP ARTHROPLASTY ANTERIOR APPROACH (Left) Patient reports pain as mild.    Objective: Vital signs in last 24 hours: Temp:  [97.1 F (36.2 Clark)-98.7 F (37.1 Clark)] 98 F (36.7 Clark) (03/04 0622) Pulse Rate:  [60-78] 60 (03/04 0622) Resp:  [18-20] 18 (03/04 0622) BP: (77-94)/(34-73) 94/73 mmHg (03/04 0622) SpO2:  [95 %] 95 % (03/04 0622)  Intake/Output from previous day: 03/03 0701 - 03/04 0700 In: 400 [P.O.:400] Out: 150 [Urine:150] Intake/Output this shift: Total I/O In: 120 [P.O.:120] Out: -    Recent Labs  07/22/15 0604 07/23/15 0406  HGB 11.3* 9.2*    Recent Labs  07/22/15 0604 07/23/15 0406  WBC 8.5 7.4  RBC 3.55* 2.99*  HCT 33.0* 27.5*  PLT 177 143*    Recent Labs  07/22/15 0604  NA 137  K 4.5  CL 103  CO2 24  BUN 29*  CREATININE 2.19*  GLUCOSE 138*  CALCIUM 8.2*   No results for input(s): LABPT, INR in the last 72 hours.  Neurologically intact  Assessment/Plan: 2 Days Post-Op Procedure(s) (LRB): LEFT TOTAL HIP ARTHROPLASTY ANTERIOR APPROACH (Left) Discharge home with home health  , doing stairs with physical therapy right now.   [name] 07/23/2015, 9:21 AM

## 2015-07-23 NOTE — Progress Notes (Signed)
Physical Therapy Treatment Patient Details Name: [name] MRN: [id number] DOB: 07-27-1940 Today's Date: 07/23/2015    History of Present Illness Pt is a 75 y/o M s/p Lt THA.  Pt's PMH includes dysrhythmia, skin cancer, jaundice, Rt THA.      PT Comments    Patient is making good progress with PT. Educated pt on HEP and use of ice, stair training, and reviewed HEP handout. Daughter present and very supportive.  From a mobility standpoint anticipate patient will be ready for DC home when medically ready.     Follow Up Recommendations  Home health PT;Supervision for mobility/OOB     Equipment Recommendations  None recommended by PT    Recommendations for Other Services OT consult     Precautions / Restrictions Precautions Precautions: Fall Precaution Comments: No hip precaution order.  Direct anterior? Op note mentions use of Hana table Restrictions Weight Bearing Restrictions: Yes LLE Weight Bearing: Weight bearing as tolerated    Mobility  Bed Mobility               General bed mobility comments: OOB in recliner upon arrival  Transfers Overall transfer level: Needs assistance Equipment used: Rolling walker (2 wheeled) Transfers: Sit to/from Stand Sit to Stand: Min guard         General transfer comment: min guard for safety; c/o dizziness upon standing with unsteadiness noted; close guard assist with pt able to gain balance; pt instructed to take deep breaths and reported dizziness had subsided  Ambulation/Gait Ambulation/Gait assistance: Supervision Ambulation Distance (Feet): 200 Feet Assistive device: Rolling walker (2 wheeled) Gait Pattern/deviations: Step-through pattern;Decreased stride length;Trunk flexed Gait velocity: decreased   General Gait Details: improved symmetrical step lengths and step through pattern; cues for position of RW at times and posture; pt able to correct with cues; no unsteadiness while ambulating   Stairs Stairs:  Yes Stairs assistance: Min guard Stair Management: No rails;Backwards;With walker Number of Stairs: 3 General stair comments: cues for sequencing and technique with some carry over demonstrated from previous session; no unsteadiness on stairs; pt reported feeling comfortable with managing steps at home  Wheelchair Mobility    Modified Rankin (Stroke Patients Only)       Balance Overall balance assessment: Needs assistance Sitting-balance support: Feet supported;No upper extremity supported Sitting balance-Leahy Scale: Good     Standing balance support: Single extremity supported Standing balance-Leahy Scale: Fair                      Cognition Arousal/Alertness: Awake/alert Behavior During Therapy: WFL for tasks assessed/performed Overall Cognitive Status: Within Functional Limits for tasks assessed                      Exercises Total Joint Exercises Ankle Circles/Pumps: AROM;Both;15 reps;Seated Gluteal Sets: AROM;Both;10 reps;Seated Heel Slides: AROM;Left;10 reps;Seated Hip ABduction/ADduction: AAROM;Left;10 reps;Seated    General Comments General comments (skin integrity, edema, etc.): educated on HEP, reviewed handout, and use of ice; daughter present for most of session and very supportive      Pertinent Vitals/Pain Pain Assessment: No/denies pain Pain Intervention(s): Monitored during session;Premedicated before session;Ice applied    Home Living                      Prior Function            PT Goals (current goals can now be found in the care plan section) Acute Rehab PT Goals Patient Stated  Goal: to go home PT Goal Formulation: With patient/family Time For Goal Achievement: 07/28/15 Potential to Achieve Goals: Good Progress towards PT goals: Progressing toward goals    Frequency  7X/week    PT Plan Current plan remains appropriate    Co-evaluation             End of Session Equipment Utilized During Treatment:  Gait belt Activity Tolerance: Patient tolerated treatment well Patient left: with call bell/phone within reach;with family/visitor present;in chair     Time: [id number] PT Time Calculation (min) (ACUTE ONLY): 27 min  Charges:  $Gait Training: 8-22 mins $Therapeutic Exercise: 8-22 mins                    G Codes:      Salina April, PTA Pager: [phone]   07/23/2015, 9:45 AM

## 2015-07-23 NOTE — Progress Notes (Signed)
Occupational Therapy Treatment Patient Details Name: [name] MRN: [id number] DOB: 1940-06-25 Today's Date: 07/23/2015    History of present illness Pt is a 75 y/o M s/p Lt THA.  Pt's PMH includes dysrhythmia, skin cancer, jaundice, Rt THA.     OT comments  Pt. Able to complete tub transfer min A with dtr. Present to also receive education.  At this time they verbalize pt. Will sponge bathe initially.  Pt. And dtr. Report wife available to assist with LB ADLS.  All education complete, note d/c likely later today.    Follow Up Recommendations  No OT follow up;Supervision/Assistance - 24 hour    Equipment Recommendations  None recommended by OT    Recommendations for Other Services      Precautions / Restrictions Precautions Precautions: Fall Precaution Comments: No hip precaution order.  Direct anterior? Op note mentions use of Hana table Restrictions Weight Bearing Restrictions: Yes LLE Weight Bearing: Weight bearing as tolerated       Mobility Bed Mobility               General bed mobility comments: up in recliner upon arrival to room  Transfers Overall transfer level: Needs assistance Equipment used: Rolling walker (2 wheeled)   Sit to Stand: Min guard              Balance                                   ADL Overall ADL's : Needs assistance/impaired               Lower Body Bathing Details (indicate cue type and reason): wife available to assist at home as needed       Lower Body Dressing Details (indicate cue type and reason): wife available to assist at home as needed         Tub/ Shower Transfer: Tub transfer;Minimal assistance;3 in 1;Rolling walker Tub/Shower Transfer Details (indicate cue type and reason): side step over tub for L faucet and will have 3n1 to sit on Functional mobility during ADLs: Min guard;Rolling walker General ADL Comments: min a for tub transfer, dtr. robyn present and educated on proper hand  placement and assistance for in/out.  reviewed pt. needs a 2nd person to physcially assist at this point.  she states she does not feel comfortable due to it is her dad and privacy concerns.  reviewed options for pt. to enter wearing a robe and hand the robe out.  she shook her head nervously and mentioned "nathan" maybe could help.  then states "mama is probably going to just say bird bath at first".  reviewed this option was also fine and emphasized need to review this tub tranfer with home therapy prior to attempting with family assist.  pt. and dtr. verbalized understanding and state the transfer will not be attmepted until all parties feel comfortable.      Vision                     Perception     Praxis      Cognition   Behavior During Therapy: Flat affect Overall Cognitive Status: Within Functional Limits for tasks assessed                       Extremity/Trunk Assessment  Exercises     Shoulder Instructions       General Comments      Pertinent Vitals/ Pain       Pain Assessment: No/denies pain Pain Intervention(s): Premedicated before session  Home Living                                          Prior Functioning/Environment              Frequency Min 2X/week     Progress Toward Goals  OT Goals(current goals can now be found in the care plan section)  Progress towards OT goals: Goals met/education completed, patient discharged from [facility] Discharge plan remains appropriate    Co-evaluation                 End of Session Equipment Utilized During Treatment: Gait belt;Rolling walker   Activity Tolerance Patient tolerated treatment well   Patient Left in chair;with call bell/phone within reach;with family/visitor present   Nurse Communication          Time: 1497-0263 OT Time Calculation (min): 27 min  Charges: OT General Charges $OT Visit: 1 Procedure OT Treatments $Self  Care/Home Management : 23-37 mins  Janice Coffin, COTA/L 07/23/2015, 8:41 AM

## 2015-07-25 NOTE — Discharge Summary (Signed)
Patient ID: CULLEY PLANO MRN: [id number] DOB/AGE: [birth year] 73 y.o.  Admit date: 07/21/2015 Discharge date: 07/25/2015  Admission Diagnoses:  Principal Problem:   Osteoarthritis of left hip Active Problems:   Status post total replacement of left hip   Discharge Diagnoses:  Same  Past Medical History  Diagnosis Date  . PONV (postoperative nausea and vomiting)   . Hypertension   . Dysrhythmia     IRREG HEARTBEAT  . Arthritis   . Cancer ([place])   . 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 Jaundice     AGE 48    Surgeries: Procedure(s): LEFT TOTAL HIP ARTHROPLASTY ANTERIOR APPROACH on 07/21/2015   Consultants:    Discharged Condition: Improved  Hospital Course: [name] is an 75 y.o. male who was admitted 07/21/2015 for operative treatment ofOsteoarthritis of left hip. Patient has severe unremitting pain that affects sleep, daily activities, and work/hobbies. After pre-op clearance the patient was taken to the operating room on 07/21/2015 and underwent  Procedure(s): LEFT TOTAL HIP ARTHROPLASTY ANTERIOR APPROACH.    Patient was given perioperative antibiotics: Anti-infectives    Start     Dose/Rate Route Frequency Ordered Stop   07/21/15 1730  ceFAZolin (ANCEF) IVPB 1 g/50 mL premix     1 g 100 mL/hr over 30 Minutes Intravenous Every 6 hours 07/21/15 1445 07/21/15 2319   07/21/15 0600  ceFAZolin (ANCEF) IVPB 2 g/50 mL premix     2 g 100 mL/hr over 30 Minutes Intravenous To ShortStay Surgical 07/20/15 1246 07/21/15 1155       Patient was given sequential compression devices, early ambulation, and chemoprophylaxis to prevent DVT.  Patient benefited maximally from hospital stay and there were no complications.    Recent vital signs: No data found.    Recent laboratory studies:  Recent Labs  07/23/15 0406  WBC 7.4  HGB 9.2*  HCT 27.5*  PLT 143*     Discharge Medications:     Medication List    STOP taking these medications        HYDROcodone-acetaminophen 5-325 MG tablet  Commonly known as:  NORCO/VICODIN      TAKE these medications        aspirin 325 MG EC tablet  Take 1 tablet (325 mg total) by mouth daily with breakfast.     clopidogrel 75 MG tablet  Commonly known as:  PLAVIX  Take 75 mg by mouth daily.     loratadine 10 MG tablet  Commonly known as:  CLARITIN  Take 10 mg by mouth daily.     metoprolol 50 MG tablet  Commonly known as:  LOPRESSOR  Take 25 mg by mouth at bedtime.     multivitamin with minerals Tabs tablet  Take 1 tablet by mouth daily.     NITROSTAT 0.4 MG SL tablet  Generic drug:  nitroGLYCERIN  Place 0.4 mg under the tongue every 5 (five) minutes as needed for chest pain.     oxyCODONE 5 MG immediate release tablet  Commonly known as:  Oxy IR/ROXICODONE  Take 1-3 tablets (5-15 mg total) by mouth every 4 (four) hours as needed for severe pain or breakthrough pain.     simvastatin 40 MG tablet  Commonly known as:  ZOCOR  Take 40 mg by mouth every evening.     tamsulosin 0.4 MG Caps capsule  Commonly known as:  FLOMAX  Take 0.4 mg by mouth daily.     tiZANidine 4 MG tablet  Commonly known as:  ZANAFLEX  Take 1 tablet (4 mg total) by mouth every 8 (eight) hours as needed for muscle spasms.     valsartan-hydrochlorothiazide 320-12.5 MG tablet  Commonly known as:  DIOVAN-HCT  Take 1 tablet by mouth daily.        Diagnostic Studies: Dg Hip Port Unilat With Pelvis 1v Left  07/21/2015  CLINICAL DATA:  Status post left hip arthroplasty EXAM: DG HIP (WITH OR WITHOUT PELVIS) 1V PORT LEFT COMPARISON:  None. FINDINGS: Bilateral hip replacements are noted. No acute bony abnormality is seen. Calcifications are noted in the prostate. No soft tissue changes are seen. IMPRESSION: Status post left hip replacement Electronically Signed   By: Inez Catalina M.D.   On: 07/21/2015 14:07   Dg Hip Operative Unilat With Pelvis Left  07/21/2015  CLINICAL DATA:  LEFT TOTAL HIP ARTHROPLASTY WITH  ANTERIOR APPROACH RSTO: [place] FLUORO TIME: 45 SECS EXAM: OPERATIVE LEFT HIP (WITH PELVIS IF PERFORMED) 5 VIEWS TECHNIQUE: Fluoroscopic spot image(s) were submitted for interpretation post-operatively. COMPARISON:  CT of the abdomen and pelvis on 03/09/2014 FINDINGS: Intraoperative images are performed, demonstrating remote right hip arthroplasty and images prior to and following left hip arthroplasty. The new arthroplasty demonstrates normal alignment and no evidence for dislocation. Note is made of prostatic calcifications. IMPRESSION: Status post left hip arthroplasty.  No adverse features. Electronically Signed   By: Nolon Nations M.D.   On: 07/21/2015 13:38    Disposition: 01-Home or Self Care        Follow-up Information    Follow up with [facility].   Why:  Someone from [facility] will contact you concerning start date and time for therapy.   Contact information:   [address] [place] [address] [phone]       Follow up with Mcarthur Rossetti, MD In 2 weeks.   Specialty:  Orthopedic Surgery   Contact information:   [place] [address] [phone]        Signed: Mcarthur Rossetti 07/25/2015, 5:12 PM

## 2016-03-01 ENCOUNTER — Ambulatory Visit (INDEPENDENT_AMBULATORY_CARE_PROVIDER_SITE_OTHER): Payer: Medicare HMO | Admitting: Orthopaedic Surgery

## 2016-03-01 DIAGNOSIS — M25552 Pain in left hip: Secondary | ICD-10-CM | POA: Diagnosis not present

## 2016-03-01 DIAGNOSIS — M1612 Unilateral primary osteoarthritis, left hip: Secondary | ICD-10-CM | POA: Diagnosis not present

## 2016-04-05 DIAGNOSIS — I2510 Atherosclerotic heart disease of native coronary artery without angina pectoris: Secondary | ICD-10-CM | POA: Insufficient documentation

## 2016-04-05 DIAGNOSIS — N529 Male erectile dysfunction, unspecified: Secondary | ICD-10-CM | POA: Insufficient documentation

## 2016-04-05 DIAGNOSIS — I10 Essential (primary) hypertension: Secondary | ICD-10-CM

## 2016-04-05 DIAGNOSIS — E291 Testicular hypofunction: Secondary | ICD-10-CM

## 2016-04-05 DIAGNOSIS — E7849 Other hyperlipidemia: Secondary | ICD-10-CM

## 2016-04-05 DIAGNOSIS — N401 Enlarged prostate with lower urinary tract symptoms: Secondary | ICD-10-CM

## 2016-04-05 HISTORY — DX: Male erectile dysfunction, unspecified: N52.9

## 2016-04-05 HISTORY — DX: Atherosclerotic heart disease of native coronary artery without angina pectoris: I25.10

## 2016-04-05 HISTORY — DX: Benign prostatic hyperplasia with lower urinary tract symptoms: N40.1

## 2016-04-05 HISTORY — DX: Other hyperlipidemia: E78.49

## 2016-04-05 HISTORY — DX: Essential (primary) hypertension: I10

## 2016-04-05 HISTORY — DX: Testicular hypofunction: E29.1

## 2016-11-07 IMAGING — CR DG HIP (WITH OR WITHOUT PELVIS) 1V PORT*L*
2 series · 2 of 2 positions shown · non-contrast
Comparison: None.

CLINICAL DATA: Status post left hip arthroplasty

EXAM:
DG HIP (WITH OR WITHOUT PELVIS) 1V PORT LEFT

[AP (1 of 2)]
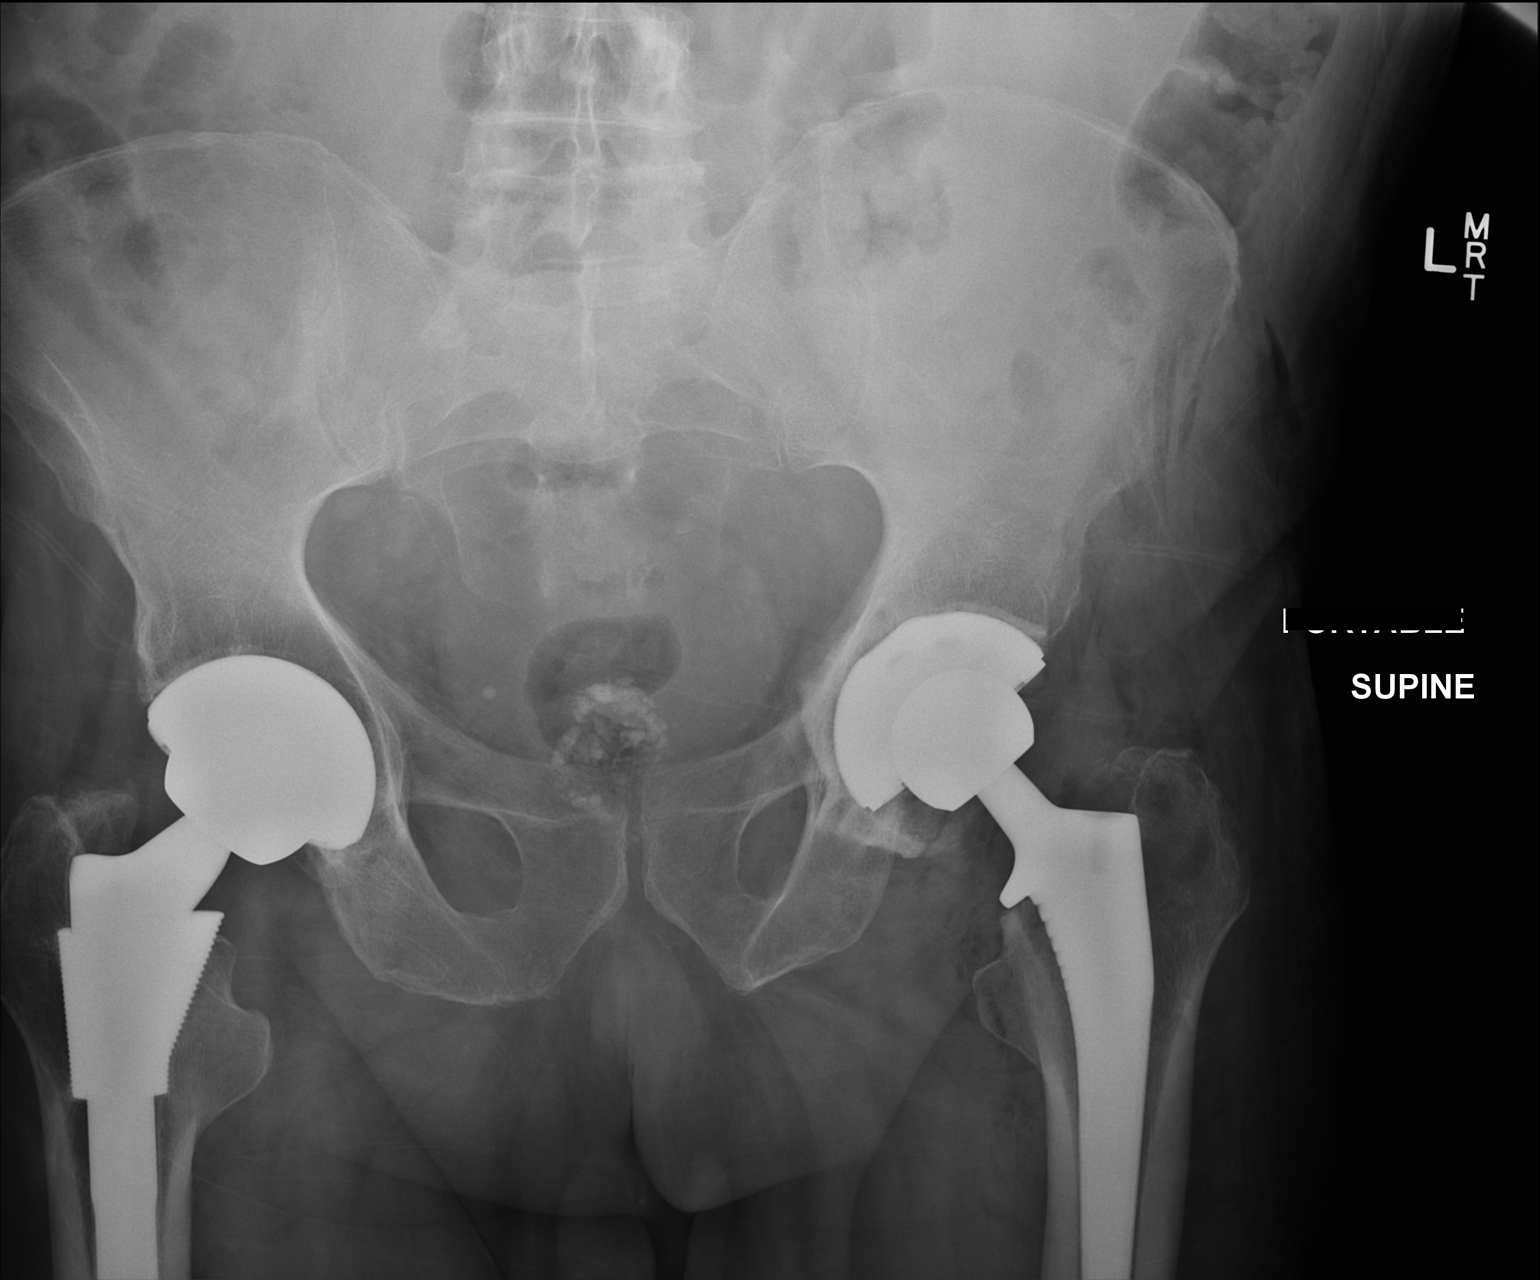

[AP (2 of 2)]
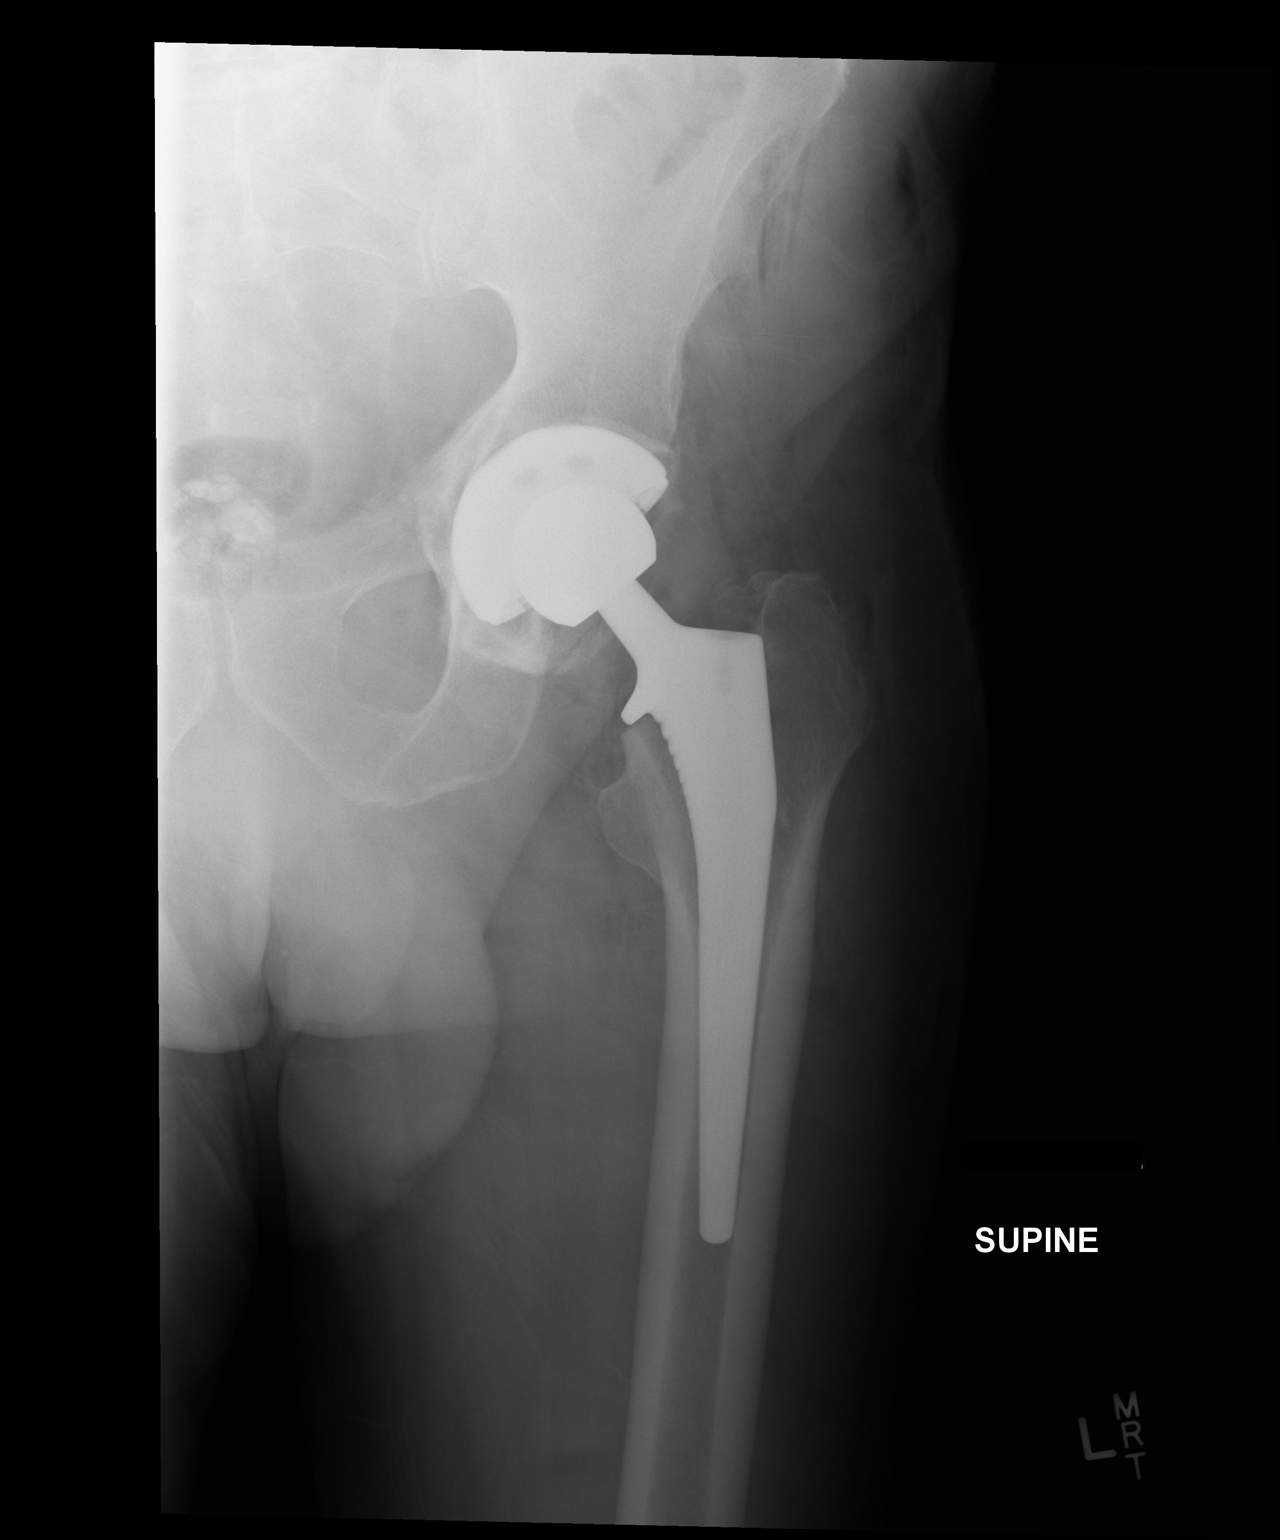

[2 of 2 positions shown; findings below may reference images not displayed]

FINDINGS: Bilateral hip replacements are noted. No acute bony abnormality is
seen. Calcifications are noted in the prostate. No soft tissue
changes are seen.
IMPRESSION: Status post left hip replacement

## 2016-11-19 ENCOUNTER — Other Ambulatory Visit (INDEPENDENT_AMBULATORY_CARE_PROVIDER_SITE_OTHER): Payer: Self-pay

## 2016-11-19 ENCOUNTER — Telehealth (INDEPENDENT_AMBULATORY_CARE_PROVIDER_SITE_OTHER): Payer: Self-pay | Admitting: Radiology

## 2016-11-19 MED ORDER — TRAMADOL HCL 50 MG PO TABS: ORAL_TABLET | ORAL | 0 refills | Status: DC

## 2016-11-19 NOTE — Telephone Encounter (Signed)
Called into pharmacy

## 2016-11-19 NOTE — Telephone Encounter (Signed)
Patient is requesting pain medication. CB# [phone]

## 2016-11-19 NOTE — Telephone Encounter (Signed)
Please advise 

## 2016-11-19 NOTE — Telephone Encounter (Signed)
All we can do is Tramadol 50 mg, 1-2 po bid prn pain, #60, no refills

## 2017-02-04 ENCOUNTER — Other Ambulatory Visit: Payer: Self-pay | Admitting: Cardiology

## 2017-02-04 ENCOUNTER — Other Ambulatory Visit: Payer: Self-pay

## 2017-02-04 ENCOUNTER — Telehealth: Payer: Self-pay

## 2017-02-04 DIAGNOSIS — B0229 Other postherpetic nervous system involvement: Secondary | ICD-10-CM

## 2017-02-04 HISTORY — DX: Other postherpetic nervous system involvement: B02.29

## 2017-02-04 MED ORDER — METOPROLOL TARTRATE 50 MG PO TABS
25.0000 mg | ORAL_TABLET | Freq: Every day | ORAL | 1 refills | Status: DC
Start: 2017-02-04 — End: 2017-11-25

## 2017-02-04 MED ORDER — SIMVASTATIN 40 MG PO TABS: 40.0000 mg | ORAL_TABLET | Freq: Every evening | ORAL | 1 refills | Status: DC

## 2017-02-04 NOTE — Telephone Encounter (Signed)
Spoke with pt earlier in the day, and he was unsure if he was going to follow up with Dr Bettina Gavia in [facility] for his cardiac care.  His wife called back later in the day, and states that he will flup with Dr Bettina Gavia in April 2019.  Medications were sent to [facility].  Pts wife verbalized understanding.

## 2017-03-06 ENCOUNTER — Other Ambulatory Visit: Payer: Self-pay | Admitting: Cardiology

## 2017-06-24 ENCOUNTER — Other Ambulatory Visit: Payer: Self-pay | Admitting: Cardiology

## 2017-08-26 ENCOUNTER — Other Ambulatory Visit: Payer: Self-pay | Admitting: Cardiology

## 2017-09-09 ENCOUNTER — Ambulatory Visit: Payer: Medicare HMO | Admitting: Cardiology

## 2017-10-29 ENCOUNTER — Other Ambulatory Visit: Payer: Self-pay | Admitting: Cardiology

## 2017-11-25 ENCOUNTER — Other Ambulatory Visit: Payer: Self-pay | Admitting: Cardiology

## 2017-11-29 ENCOUNTER — Encounter: Payer: Self-pay | Admitting: Cardiology

## 2017-11-29 ENCOUNTER — Ambulatory Visit: Payer: Medicare HMO | Admitting: Cardiology

## 2017-11-29 VITALS — BP 170/70 | HR 53 | Ht 72.0 in | Wt 223.0 lb

## 2017-11-29 DIAGNOSIS — I10 Essential (primary) hypertension: Secondary | ICD-10-CM

## 2017-11-29 DIAGNOSIS — R001 Bradycardia, unspecified: Secondary | ICD-10-CM | POA: Insufficient documentation

## 2017-11-29 DIAGNOSIS — E7849 Other hyperlipidemia: Secondary | ICD-10-CM

## 2017-11-29 DIAGNOSIS — I25118 Atherosclerotic heart disease of native coronary artery with other forms of angina pectoris: Secondary | ICD-10-CM | POA: Diagnosis not present

## 2017-11-29 HISTORY — DX: Bradycardia, unspecified: R00.1

## 2017-11-29 MED ORDER — TELMISARTAN 20 MG PO TABS: 20.0000 mg | ORAL_TABLET | Freq: Every day | ORAL | 5 refills | Status: DC

## 2017-11-29 NOTE — Patient Instructions (Signed)
Medication Instructions:  Your physician has recommended you make the following change in your medication:   START Telmisartin 20mg  one tablet daily  Labwork: Your physician recommends that you return for lab work in: 1 week-CMP and Lipids  You will have BP check in one week when you return to office for lab work.   Testing/Procedures: You had an EKG today  Follow-Up: Your physician wants you to follow-up in: 6 months You will receive a reminder letter in the mail two months in advance. If you don't receive a letter, please call our office to schedule the follow-up appointment.   Any Other Special Instructions Will Be Listed Below (If Applicable).     If you need a refill on your cardiac medications before your next appointment, please call your pharmacy.    Any Other Special Instructions Will Be Listed Below (If Applicable).     If you need a refill on your cardiac medications before your next appointment, please call your pharmacy.

## 2017-11-29 NOTE — Progress Notes (Signed)
Cardiology Office Note:    Date:  11/29/2017   ID:  [name], DOB 17-Jan-1941, MRN [id number]  PCP:  Jay Greenhouse, MD  Cardiologist:  Jay More, MD    Referring MD: Jay Greenhouse, MD    ASSESSMENT:    1. Coronary artery disease of native artery of native heart with stable angina pectoris ([place])   2. Benign essential hypertension   3. Familial combined hyperlipidemia    PLAN:    In order of problems listed above:  1. Coronary disease is stable continue medical treatment I do not think requires an ischemia evaluation at this time. 2. Poorly controlled in the past he is on ARB he is bradycardic.  As noted dose of beta-blocker resume an ARB and follow-up renal function potassium on 1 week 3. Continue statin check liver function for safety lipids for efficacy.   Next appointment: 6 months, BP check 1 week   Medication Adjustments/Labs and Tests Ordered: Current medicines are reviewed at length with the patient today.  Concerns regarding medicines are outlined above.  No orders of the defined types were placed in this encounter.  No orders of the defined types were placed in this encounter.   Chief Complaint  Patient presents with  . Follow-up  . Coronary Artery Disease    History of Present Illness:    [name] is a 77 y.o. male with a hx of coronary artery disease with PCI 2014 drug-eluting stent to the LAD diagonal and right coronary artery as well as hypertension and dis-lipidemia last seen 09/04/16. Compliance with diet, lifestyle and medications: Yes  Recently he and his wife had noted episodes where his heart rate is in the range of 50 bpm and he feels poorly.  He has not been checking his blood pressure recently.  He has had no chest pain dyspnea palpitation or syncope.  Said no nitroglycerin usage. Past Medical History:  Diagnosis Date  . Arthritis   . Benign essential hypertension 04/05/2016  . CAD (coronary artery disease), native coronary  artery 04/05/2016   Overview:  Formatting of this note may be different from the original.      11/2012: DES of LAD, D2 and RCA   . Cancer ([place])   . Dysrhythmia    IRREG HEARTBEAT  . 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 Erectile dysfunction 04/05/2016  . 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 Hypertension   . 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 Jaundice    AGE 34  . Osteoarthritis of hip 07/21/2015  . 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 PONV (postoperative nausea and vomiting)   . 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   Overview:  2018  . Preoperative cardiovascular examination 05/05/2015  . 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 CARDIAC CATHETERIZATION    . CHOLECYSTECTOMY     2002 [place]  . COLON SURGERY     04/2012 [facility]  . CORONARY ANGIOPLASTY     [facility]  2014  . HIP ARTHROPLASTY     RIGHT HIP  2012  . TOTAL HIP ARTHROPLASTY Left 07/21/2015   Procedure: LEFT TOTAL HIP ARTHROPLASTY ANTERIOR APPROACH;  Surgeon: Mcarthur Rossetti, MD;  Location: [facility];  Service: Orthopedics;  Laterality: Left;    Current Medications: Current Meds  Medication Sig  .  Ascorbic Acid (VITAMIN C) 1000 MG tablet Take 1,000 mg by mouth daily.  . clopidogrel (PLAVIX) 75 MG tablet TAKE 1 TABLET EVERY DAY  . gabapentin (NEURONTIN) 100 MG capsule TAKE 1 CAPSULE BY MOUTH THREE TIMES A DAY FOR PAIN  . loratadine (CLARITIN) 10 MG tablet Take 10 mg by mouth daily.  . metoprolol tartrate (LOPRESSOR) 50 MG tablet TAKE 1/2 TABLET AT BEDTIME.  . Multiple Vitamin (MULTIVITAMIN WITH MINERALS) TABS tablet Take 1 tablet by mouth daily.  . nitroGLYCERIN (NITROSTAT) 0.4 MG SL tablet Place 0.4 mg under the tongue every 5 (five) minutes as needed for chest pain.   . Omega-3 Fatty Acids (FISH OIL) 1000 MG CAPS Take 1 capsule by  mouth daily.  . Psyllium (METAMUCIL) 28.3 % POWD Take by mouth. 1 tbs per day  . simvastatin (ZOCOR) 40 MG tablet TAKE 1 TABLET EVERY EVENING.  . tamsulosin (FLOMAX) 0.4 MG CAPS capsule Take 0.4 mg by mouth daily.  . vitamin C (ASCORBIC ACID) 500 MG tablet Take 500 mg by mouth daily.     Allergies:   Patient has no known allergies.   Social History   Socioeconomic History  . Marital status: Married    Spouse name: Not on file  . Number of children: Not on file  . Years of education: Not on file  . Highest education level: Not on file  Occupational History  . Not on file  Social Needs  . Financial resource strain: Not on file  . Food insecurity:    Worry: Not on file    Inability: Not on file  . Transportation needs:    Medical: Not on file    Non-medical: Not on file  Tobacco Use  . Smoking status: Former Research scientist (life sciences)  . Smokeless tobacco: Never Used  Substance and Sexual Activity  . Alcohol use: No  . Drug use: No  . Sexual activity: Not on file  Lifestyle  . Physical activity:    Days per week: Not on file    Minutes per session: Not on file  . Stress: Not on file  Relationships  . Social connections:    Talks on phone: Not on file    Gets together: Not on file    Attends religious service: Not on file    Active member of club or organization: Not on file    Attends meetings of clubs or organizations: Not on file    Relationship status: Not on file  Other Topics Concern  . Not on file  Social History Narrative  . Not on file     Family History: The patient's family history includes Colon cancer in his mother; Heart disease in his father. ROS:   Please see the history of present illness.    All other systems reviewed and are negative.  EKGs/Labs/Other Studies Reviewed:    The following studies were reviewed today:  EKG:  EKG ordered today.  The ekg ordered today demonstrates sinus rhythm left atrial enlargement 1 PVC rate is 53 bpm  Recent Labs:   CMP  normal 09/04/2016 No results found for requested labs within last 8760 hours.  Recent Lipid Panel   09/04/2016 cholesterol 134 HDL 47 LDL 57 No results found for: CHOL, TRIG, HDL, CHOLHDL, VLDL, LDLCALC, LDLDIRECT  Physical Exam:    VS:  Ht 6' (1.829 m)   Wt 223 lb (101.2 kg)   BMI 30.24 kg/m     Wt Readings from Last 3 Encounters:  11/29/17 223 lb (101.2  kg)  07/05/15 217 lb 8 oz (98.7 kg)     GEN:  Well nourished, well developed in no acute distress HEENT: Normal NECK: No JVD; No carotid bruits LYMPHATICS: No lymphadenopathy CARDIAC: RRR, no murmurs, rubs, gallops RESPIRATORY:  Clear to auscultation without rales, wheezing or rhonchi  ABDOMEN: Soft, non-tender, non-distended MUSCULOSKELETAL:  No edema; No deformity  SKIN: Warm and dry NEUROLOGIC:  Alert and oriented x 3 PSYCHIATRIC:  Normal affect    Signed, Jay More, MD  11/29/2017 11:41 AM    [place]

## 2017-12-06 ENCOUNTER — Other Ambulatory Visit: Payer: Self-pay

## 2017-12-06 ENCOUNTER — Ambulatory Visit: Payer: Medicare HMO

## 2017-12-06 ENCOUNTER — Ambulatory Visit (INDEPENDENT_AMBULATORY_CARE_PROVIDER_SITE_OTHER): Payer: Medicare HMO | Admitting: Cardiology

## 2017-12-06 VITALS — BP 162/78

## 2017-12-06 DIAGNOSIS — I10 Essential (primary) hypertension: Secondary | ICD-10-CM

## 2017-12-06 DIAGNOSIS — E7849 Other hyperlipidemia: Secondary | ICD-10-CM

## 2017-12-06 MED ORDER — TELMISARTAN 20 MG PO TABS: 20.0000 mg | ORAL_TABLET | Freq: Two times a day (BID) | ORAL | 5 refills | Status: DC

## 2017-12-06 NOTE — Progress Notes (Signed)
Patient presented today for Blood Pressure check due to telmisartan therapy. Per Dr. Bettina Gavia would like to change therapy on telmisartan form taking it daily to twice daily, and patient will follow up in 1 week for another Blood Pressure check

## 2017-12-06 NOTE — Patient Instructions (Addendum)
Medication Instructions:  Your physician has recommended you make the following change in your medication:  CHANGE:  Telmisartan 20 mg two times daily.  Labwork: Your physician recommends that you return for lab work: CMP, Lipids.  Testing/Procedures: None  Follow-Up:Your physician recommends that you schedule a follow-up appointment in: 1 week   Any Other Special Instructions Will Be Listed Below (If Applicable).     If you need a refill on your cardiac medications before your next appointment, please call your pharmacy.

## 2017-12-07 LAB — COMPREHENSIVE METABOLIC PANEL
A/G RATIO: 1.9 (ref 1.2–2.2)
ALBUMIN: 4.2 g/dL (ref 3.5–4.8)
ALK PHOS: 64 IU/L (ref 39–117)
ALT: 10 IU/L (ref 0–44)
AST: 18 IU/L (ref 0–40)
BILIRUBIN TOTAL: 0.8 mg/dL (ref 0.0–1.2)
BUN/Creatinine Ratio: 13 (ref 10–24)
BUN: 14 mg/dL (ref 8–27)
CHLORIDE: 103 mmol/L (ref 96–106)
CO2: 26 mmol/L (ref 20–29)
Calcium: 9.1 mg/dL (ref 8.6–10.2)
Creatinine, Ser: 1.1 mg/dL (ref 0.76–1.27)
GFR calc Af Amer: 76 mL/min/{1.73_m2} (ref >59)
GFR calc non Af Amer: 66 mL/min/{1.73_m2} (ref >59)
GLUCOSE: 87 mg/dL (ref 65–99)
Globulin, Total: 2.2 g/dL (ref 1.5–4.5)
Potassium: 4.9 mmol/L (ref 3.5–5.2)
Sodium: 144 mmol/L (ref 134–144)
Total Protein: 6.4 g/dL (ref 6.0–8.5)

## 2017-12-07 LAB — LIPID PANEL W/O CHOL/HDL RATIO
CHOLESTEROL TOTAL: 134 mg/dL (ref 100–199)
HDL: 48 mg/dL (ref >39)
LDL Calculated: 59 mg/dL (ref 0–99)
TRIGLYCERIDES: 133 mg/dL (ref 0–149)
VLDL Cholesterol Cal: 27 mg/dL (ref 5–40)

## 2017-12-11 ENCOUNTER — Other Ambulatory Visit: Payer: Self-pay

## 2017-12-11 MED ORDER — TELMISARTAN 20 MG PO TABS: 20.0000 mg | ORAL_TABLET | Freq: Two times a day (BID) | ORAL | 5 refills | Status: DC

## 2017-12-13 ENCOUNTER — Ambulatory Visit (INDEPENDENT_AMBULATORY_CARE_PROVIDER_SITE_OTHER): Payer: Medicare HMO | Admitting: Cardiology

## 2017-12-13 VITALS — BP 140/72 | HR 66 | Ht 72.0 in | Wt 223.0 lb

## 2017-12-13 DIAGNOSIS — I10 Essential (primary) hypertension: Secondary | ICD-10-CM

## 2017-12-13 MED ORDER — CARVEDILOL 3.125 MG PO TABS: 3.1250 mg | ORAL_TABLET | Freq: Two times a day (BID) | ORAL | 2 refills | Status: DC

## 2017-12-13 NOTE — Patient Instructions (Signed)
Medication Instructions:  Your physician has recommended you make the following change in your medication:  START carvedilol 3.125 mg twice daily  Labwork: Your physician recommends that you have the following labs drawn: BMP today  Testing/Procedures: None  Follow-Up: Your physician recommends that you schedule a follow-up appointment in: as directed previously  Any Other Special Instructions Will Be Listed Below (If Applicable).     If you need a refill on your cardiac medications before your next appointment, please call your pharmacy.   [place], RN, BSN

## 2017-12-13 NOTE — Progress Notes (Signed)
Patient presented for nurse visit due to increased telmisartan. Patient stated he has been tolerating the medication change well. Blood pressure log reflected that another medication added would be tolerated. Per Dr. Geraldo Pitter carvedilol 3.125 mg twice daily was added. Patient was agreeable to new medication. Instructed to continue logging blood pressure. BMP collected today.

## 2017-12-14 LAB — BASIC METABOLIC PANEL
BUN / CREAT RATIO: 12 (ref 10–24)
BUN: 15 mg/dL (ref 8–27)
CALCIUM: 8.6 mg/dL (ref 8.6–10.2)
CHLORIDE: 104 mmol/L (ref 96–106)
CO2: 25 mmol/L (ref 20–29)
Creatinine, Ser: 1.3 mg/dL — ABNORMAL HIGH (ref 0.76–1.27)
GFR calc Af Amer: 62 mL/min/{1.73_m2} (ref >59)
GFR calc non Af Amer: 54 mL/min/{1.73_m2} — ABNORMAL LOW (ref >59)
GLUCOSE: 109 mg/dL — AB (ref 65–99)
Potassium: 4.9 mmol/L (ref 3.5–5.2)
Sodium: 142 mmol/L (ref 134–144)

## 2017-12-16 ENCOUNTER — Other Ambulatory Visit: Payer: Self-pay

## 2017-12-16 ENCOUNTER — Telehealth: Payer: Self-pay | Admitting: Cardiology

## 2017-12-16 DIAGNOSIS — I10 Essential (primary) hypertension: Secondary | ICD-10-CM

## 2017-12-16 NOTE — Telephone Encounter (Signed)
Requested that the patient's wife to call the insurance company first. The wife was agreeable.

## 2017-12-16 NOTE — Telephone Encounter (Signed)
Please call patient regarding his Telmasartin and insurance not covering it.[name]

## 2017-12-19 ENCOUNTER — Other Ambulatory Visit: Payer: Self-pay

## 2017-12-19 MED ORDER — TELMISARTAN 20 MG PO TABS: 40.0000 mg | ORAL_TABLET | Freq: Every day | ORAL | 3 refills | Status: DC

## 2017-12-19 NOTE — Telephone Encounter (Signed)
Informed the wife that the patient will take micardis 40 mg daily.

## 2017-12-19 NOTE — Telephone Encounter (Signed)
Patients wife states that she called the insurance compan and they needed to speak to the the RN personally. Patient's wife is getting concnerned. Please call her with the update.

## 2017-12-23 ENCOUNTER — Other Ambulatory Visit: Payer: Self-pay | Admitting: Cardiology

## 2018-01-16 ENCOUNTER — Other Ambulatory Visit: Payer: Self-pay

## 2018-01-16 DIAGNOSIS — I10 Essential (primary) hypertension: Secondary | ICD-10-CM

## 2018-01-16 LAB — BASIC METABOLIC PANEL
BUN/Creatinine Ratio: 10 (ref 10–24)
BUN: 11 mg/dL (ref 8–27)
CHLORIDE: 103 mmol/L (ref 96–106)
CO2: 26 mmol/L (ref 20–29)
Calcium: 9.2 mg/dL (ref 8.6–10.2)
Creatinine, Ser: 1.11 mg/dL (ref 0.76–1.27)
GFR calc Af Amer: 75 mL/min/{1.73_m2} (ref >59)
GFR, EST NON AFRICAN AMERICAN: 65 mL/min/{1.73_m2} (ref >59)
Glucose: 89 mg/dL (ref 65–99)
POTASSIUM: 4.7 mmol/L (ref 3.5–5.2)
Sodium: 142 mmol/L (ref 134–144)

## 2018-01-17 ENCOUNTER — Telehealth: Payer: Self-pay

## 2018-01-17 MED ORDER — AMLODIPINE BESYLATE 5 MG PO TABS: 5.0000 mg | ORAL_TABLET | Freq: Every day | ORAL | 3 refills | Status: DC

## 2018-01-17 NOTE — Telephone Encounter (Signed)
Patient was called and notified of results. 

## 2018-01-17 NOTE — Telephone Encounter (Signed)
Per Dr. Geraldo Pitter would like to start patient on  amlodipine 5 mg daily and check Blood pressures daily and see if that helps with blood pressure and I called patient and notified him

## 2018-01-17 NOTE — Addendum Note (Signed)
Addended by: Tarri Glenn on: 01/17/2018 11:36 AM   Modules accepted: Orders

## 2018-01-17 NOTE — Telephone Encounter (Signed)
-----   Message from Jenean Lindau, MD sent at 01/16/2018  5:01 PM EDT ----- The results of the study is unremarkable. Please inform patient. I will discuss in detail at next appointment. Cc  primary care/referring physician Jenean Lindau, MD 01/16/2018 5:01 PM

## 2018-01-28 ENCOUNTER — Other Ambulatory Visit: Payer: Self-pay | Admitting: Cardiology

## 2018-01-29 ENCOUNTER — Telehealth: Payer: Self-pay

## 2018-01-29 MED ORDER — CARVEDILOL 3.125 MG PO TABS: 3.1250 mg | ORAL_TABLET | Freq: Two times a day (BID) | ORAL | 3 refills | Status: DC

## 2018-01-29 MED ORDER — AMLODIPINE BESYLATE 5 MG PO TABS: 5.0000 mg | ORAL_TABLET | Freq: Every day | ORAL | 3 refills | Status: DC

## 2018-01-29 NOTE — Telephone Encounter (Signed)
Rx sent to pharmacy as requested.

## 2018-01-31 ENCOUNTER — Other Ambulatory Visit: Payer: Self-pay

## 2018-01-31 MED ORDER — AMLODIPINE BESYLATE 5 MG PO TABS: 5.0000 mg | ORAL_TABLET | Freq: Every day | ORAL | 3 refills | Status: DC

## 2018-03-20 DIAGNOSIS — K432 Incisional hernia without obstruction or gangrene: Secondary | ICD-10-CM

## 2018-03-20 HISTORY — DX: Incisional hernia without obstruction or gangrene: K43.2

## 2018-03-25 NOTE — Progress Notes (Signed)
Cardiology Office Note:    Date:  03/26/2018   ID:  [name], DOB 1940/08/31, MRN [id number]  PCP:  Algis Greenhouse, MD  Cardiologist:  Shirlee More, MD    Referring MD: Algis Greenhouse, MD    ASSESSMENT:    1. Preoperative cardiovascular examination   2. Coronary artery disease of native artery of native heart with stable angina pectoris ([place])   3. Benign essential hypertension   4. Familial combined hyperlipidemia    PLAN:    In order of problems listed above:  1. From a cardiac perspective he is optimized to proceed with his planned surgery I asked him to stop Plavix 1 week preoperatively please tell him when he can resume after surgery. 2. Stable CAD chronic he said no angina New York Heart Association class I continue medical therapy and I do not think he requires an ischemia evaluation at this time 3. Attention is controlled but he is having marked edema due to calcium channel blocker will discontinue switch to hydralazine please draw a proBNP level with his pre-operative test copy to me 4. Stable continue lipid-lowering therapy simvastatin   Next appointment: 6 months   Medication Adjustments/Labs and Tests Ordered: Current medicines are reviewed at length with the patient today.  Concerns regarding medicines are outlined above.  No orders of the defined types were placed in this encounter.  No orders of the defined types were placed in this encounter.   Chief Complaint  Patient presents with  . Pre-op Exam    History of Present Illness:    [name] is a 77 y.o. male with a hx of  coronary artery disease with PCI 2014 drug-eluting stent to the LAD diagonal and right coronary artery as well as hypertension and dyslipidemia  last seen 11/29/17.  Has recurrent incisional hernia and is being letter for surgical intervention and is asked to see me in preoperative cardiovascular evaluation. Compliance with diet, lifestyle and medications: Yes  Overall  he is done well but since he started taking calcium channel blocker he has had marked edema in his lower extremities he sodium restricts does not have a history of heart failure is not short of breath he said no anginal discomfort exercise tolerance exceeds 5-6 METS no palpitation or syncope although he tends to have baseline sinus bradycardia.  When seen today is typical lower extremity edema of calcium channel blocker he will stop his Norvasc switch to hydralazine and I will ask with his preoperative labs show a proBNP level drawn.  Plan surgery is elective intermediate risk.  Coronary artery disease is chronic stable New York Heart Association class I hypertension controlled I do not think he requires further preoperative evaluation prior to elective hernia repair.  He will stay in the hospital overnight observation please put in a monitored bed check EKG postoperative day 1. Past Medical History:  Diagnosis Date  . Arthritis   . Benign essential hypertension 04/05/2016  . CAD (coronary artery disease), native coronary artery 04/05/2016   Overview:  Formatting of this note may be different from the original.      11/2012: DES of LAD, D2 and RCA   . Cancer ([place])   . Dysrhythmia    IRREG HEARTBEAT  . 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 Erectile dysfunction 04/05/2016  . 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 Hypertension   . 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 Jaundice    AGE 52  . Osteoarthritis  of hip 07/21/2015  . 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 PONV (postoperative nausea and vomiting)   . 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   Overview:  2018  . Preoperative cardiovascular examination 05/05/2015  . 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 CARDIAC CATHETERIZATION    . CHOLECYSTECTOMY     2002 [place]  . COLON  SURGERY     04/2012 [place]  . CORONARY ANGIOPLASTY     HIGH PT REGIONAL  2014  . HIP ARTHROPLASTY     RIGHT HIP  2012  . TOTAL HIP ARTHROPLASTY Left 07/21/2015   Procedure: LEFT TOTAL HIP ARTHROPLASTY ANTERIOR APPROACH;  Surgeon: Mcarthur Rossetti, MD;  Location: [facility];  Service: Orthopedics;  Laterality: Left;    Current Medications: Current Meds  Medication Sig  . amLODipine (NORVASC) 5 MG tablet Take 1 tablet (5 mg total) by mouth daily.  . Ascorbic Acid (VITAMIN C) 1000 MG tablet Take 1,000 mg by mouth daily.  . carvedilol (COREG) 3.125 MG tablet Take 1 tablet (3.125 mg total) by mouth 2 (two) times daily.  . clopidogrel (PLAVIX) 75 MG tablet TAKE 1 TABLET EVERY DAY  . gabapentin (NEURONTIN) 100 MG capsule TAKE 1 CAPSULE BY MOUTH THREE TIMES A DAY FOR PAIN  . Multiple Vitamin (MULTIVITAMIN WITH MINERALS) TABS tablet Take 1 tablet by mouth daily.  . nitroGLYCERIN (NITROSTAT) 0.4 MG SL tablet Place 0.4 mg under the tongue every 5 (five) minutes as needed for chest pain.   . Omega-3 Fatty Acids (FISH OIL) 1000 MG CAPS Take 1 capsule by mouth daily.  . simvastatin (ZOCOR) 40 MG tablet TAKE 1 TABLET EVERY EVENING.  . tamsulosin (FLOMAX) 0.4 MG CAPS capsule Take 0.4 mg by mouth 2 (two) times daily.   . vitamin C (ASCORBIC ACID) 500 MG tablet Take 500 mg by mouth daily.     Allergies:   Patient has no known allergies.   Social History   Socioeconomic History  . Marital status: Married    Spouse name: Not on file  . Number of children: Not on file  . Years of education: Not on file  . Highest education level: Not on file  Occupational History  . Not on file  Social Needs  . Financial resource strain: Not on file  . Food insecurity:    Worry: Not on file    Inability: Not on file  . Transportation needs:    Medical: Not on file    Non-medical: Not on file  Tobacco Use  . Smoking status: Former Research scientist (life sciences)  . Smokeless tobacco: Never Used  Substance and Sexual  Activity  . Alcohol use: No  . Drug use: No  . Sexual activity: Not on file  Lifestyle  . Physical activity:    Days per week: Not on file    Minutes per session: Not on file  . Stress: Not on file  Relationships  . Social connections:    Talks on phone: Not on file    Gets together: Not on file    Attends religious service: Not on file    Active member of club or organization: Not on file    Attends meetings of clubs or organizations: Not on file    Relationship status: Not on file  Other Topics Concern  . Not on file  Social History Narrative  .  Not on file     Family History: The patient's family history includes Colon cancer in his mother; Heart disease in his father. ROS:   Please see the history of present illness.    All other systems reviewed and are negative.  EKGs/Labs/Other Studies Reviewed:    The following studies were reviewed today:  EKG:  EKG ordered today.  The ekg ordered today demonstrates sinus bradycardia otherwise normal  Recent Labs: 12/06/2017: ALT 10 01/16/2018: BUN 11; Creatinine, Ser 1.11; Potassium 4.7; Sodium 142  Recent Lipid Panel    Component Value Date/Time   CHOL 134 12/06/2017 1151   TRIG 133 12/06/2017 1151   HDL 48 12/06/2017 1151   LDLCALC 59 12/06/2017 1151    Physical Exam:    VS:  BP (!) 142/80 (BP Location: Right Arm, Patient Position: Sitting, Cuff Size: Normal)   Pulse (!) 58   Ht 6' (1.829 m)   Wt 214 lb 3.2 oz (97.2 kg)   SpO2 98%   BMI 29.05 kg/m     Wt Readings from Last 3 Encounters:  03/26/18 214 lb 3.2 oz (97.2 kg)  12/13/17 223 lb (101.2 kg)  11/29/17 223 lb (101.2 kg)     GEN:  Well nourished, well developed in no acute distress HEENT: Normal NECK: No JVD; No carotid bruits LYMPHATICS: No lymphadenopathy CARDIAC: RRR, no murmurs, rubs, gallops RESPIRATORY:  Clear to auscultation without rales, wheezing or rhonchi  ABDOMEN: Soft, non-tender, non-distended MUSCULOSKELETAL: His DOE 4+ or greater  bilateral calcium channel blocker to the knee edema; No deformity  SKIN: Warm and dry NEUROLOGIC:  Alert and oriented x 3 PSYCHIATRIC:  Normal affect    Signed, Shirlee More, MD  03/26/2018 9:59 AM    [place]

## 2018-03-26 ENCOUNTER — Ambulatory Visit: Payer: Medicare HMO | Admitting: Cardiology

## 2018-03-26 ENCOUNTER — Encounter: Payer: Self-pay | Admitting: Cardiology

## 2018-03-26 VITALS — BP 142/80 | HR 58 | Ht 72.0 in | Wt 214.2 lb

## 2018-03-26 DIAGNOSIS — I10 Essential (primary) hypertension: Secondary | ICD-10-CM

## 2018-03-26 DIAGNOSIS — E7849 Other hyperlipidemia: Secondary | ICD-10-CM | POA: Diagnosis not present

## 2018-03-26 DIAGNOSIS — I25118 Atherosclerotic heart disease of native coronary artery with other forms of angina pectoris: Secondary | ICD-10-CM

## 2018-03-26 DIAGNOSIS — Z01810 Encounter for preprocedural cardiovascular examination: Secondary | ICD-10-CM | POA: Diagnosis not present

## 2018-03-26 MED ORDER — HYDRALAZINE HCL 25 MG PO TABS: 12.5000 mg | ORAL_TABLET | Freq: Three times a day (TID) | ORAL | 3 refills | Status: DC

## 2018-03-26 NOTE — Patient Instructions (Signed)
Medication Instructions:  Your physician has recommended you make the following change in your medication:   STOP amlodipine (norvasc)   START hydralazine (apresoline) 25 mg tablets: Take 0.5 tablet (12.5 mg) three times daily   **Please stop clopidogrel (plavix) 1 week before surgery **  If you need a refill on your cardiac medications before your next appointment, please call your pharmacy.   Lab work: None  If you have labs (blood work) drawn today and your tests are completely normal, you will receive your results only by: Marland Kitchen MyChart Message (if you have MyChart) OR . A paper copy in the mail If you have any lab test that is abnormal or we need to change your treatment, we will call you to review the results.  Testing/Procedures: You had an EKG today.   Follow-Up: At [facility], you and your health needs are our priority.  As part of our continuing mission to provide you with exceptional heart care, we have created designated Provider Care Teams.  These Care Teams include your primary Cardiologist (physician) and Advanced Practice Providers (APPs -  Physician Assistants and Nurse Practitioners) who all work together to provide you with the care you need, when you need it. You will need a follow up appointment in 6 months.  Please call our office 2 months in advance to schedule this appointment.      Hydralazine tablets What is this medicine? HYDRALAZINE (hye DRAL a zeen) is a type of vasodilator. It relaxes blood vessels, increasing the blood and oxygen supply to your heart. This medicine is used to treat high blood pressure. This medicine may be used for other purposes; ask your health care provider or pharmacist if you have questions. COMMON BRAND NAME(S): Apresoline What should I tell my health care provider before I take this medicine? They need to know if you have any of these conditions: -blood vessel disease -heart disease including angina or history of heart  attack -kidney or liver disease -systemic lupus erythematosus (SLE) -an unusual or allergic reaction to hydralazine, tartrazine dye, other medicines, foods, dyes, or preservatives -pregnant or trying to get pregnant -breast-feeding How should I use this medicine? Take this medicine by mouth with a glass of water. Follow the directions on the prescription label. Take your doses at regular intervals. Do not take your medicine more often than directed. Do not stop taking except on the advice of your doctor or health care professional. Talk to your pediatrician regarding the use of this medicine in children. Special care may be needed. While this drug may be prescribed for children for selected conditions, precautions do apply. Overdosage: If you think you have taken too much of this medicine contact a poison control center or emergency room at once. NOTE: This medicine is only for you. Do not share this medicine with others. What if I miss a dose? If you miss a dose, take it as soon as you can. If it is almost time for your next dose, take only that dose. Do not take double or extra doses. What may interact with this medicine? -medicines for high blood pressure -medicines for mental depression This list may not describe all possible interactions. Give your health care provider a list of all the medicines, herbs, non-prescription drugs, or dietary supplements you use. Also tell them if you smoke, drink alcohol, or use illegal drugs. Some items may interact with your medicine. What should I watch for while using this medicine? Visit your doctor or health care  professional for regular checks on your progress. Check your blood pressure and pulse rate regularly. Ask your doctor or health care professional what your blood pressure and pulse rate should be and when you should contact him or her. You may get drowsy or dizzy. Do not drive, use machinery, or do anything that needs mental alertness until you  know how this medicine affects you. Do not stand or sit up quickly, especially if you are an older patient. This reduces the risk of dizzy or fainting spells. Alcohol may interfere with the effect of this medicine. Avoid alcoholic drinks. Do not treat yourself for coughs, colds, or pain while you are taking this medicine without asking your doctor or health care professional for advice. Some ingredients may increase your blood pressure. What side effects may I notice from receiving this medicine? Side effects that you should report to your doctor or health care professional as soon as possible: -chest pain, or fast or irregular heartbeat -fever, chills, or sore throat -numbness or tingling in the hands or feet -shortness of breath -skin rash, redness, blisters or itching -stiff or swollen joints -sudden weight gain -swelling of the feet or legs -swollen lymph glands -unusual weakness Side effects that usually do not require medical attention (report to your doctor or health care professional if they continue or are bothersome): -diarrhea, or constipation -headache -loss of appetite -nausea, vomiting This list may not describe all possible side effects. Call your doctor for medical advice about side effects. You may report side effects to FDA at 1-800-FDA-1088. Where should I keep my medicine? Keep out of the reach of children. Store at room temperature between 15 and 30 degrees C (59 and 86 degrees F). Throw away any unused medicine after the expiration date. NOTE: This sheet is a summary. It may not cover all possible information. If you have questions about this medicine, talk to your doctor, pharmacist, or health care provider.  2018 Elsevier/Gold Standard (2007-09-19 15:44:58)

## 2018-04-02 ENCOUNTER — Other Ambulatory Visit: Payer: Self-pay | Admitting: Cardiology

## 2018-04-04 DIAGNOSIS — Z01818 Encounter for other preprocedural examination: Secondary | ICD-10-CM

## 2018-04-24 DIAGNOSIS — Z09 Encounter for follow-up examination after completed treatment for conditions other than malignant neoplasm: Secondary | ICD-10-CM

## 2018-04-24 HISTORY — DX: Encounter for follow-up examination after completed treatment for conditions other than malignant neoplasm: Z09

## 2018-05-09 DIAGNOSIS — J988 Other specified respiratory disorders: Secondary | ICD-10-CM

## 2018-05-09 HISTORY — DX: Other specified respiratory disorders: J98.8

## 2018-07-28 ENCOUNTER — Other Ambulatory Visit: Payer: Self-pay | Admitting: *Deleted

## 2018-07-28 MED ORDER — HYDRALAZINE HCL 25 MG PO TABS: 12.5000 mg | ORAL_TABLET | Freq: Three times a day (TID) | ORAL | 1 refills | Status: DC

## 2018-07-28 NOTE — Telephone Encounter (Signed)
*  STAT* If patient is at the pharmacy, call can be transferred to refill team.   1. Which medications need to be refilled? (please list name of each medication and dose if known) Hydralazine 25 mg 0.5 tablets tid  2. Which pharmacy/location (including [place] and city if local pharmacy) is medication to be sent to? [facility] on [address]  3. Do they need a 30 day or 90 day supply? [place]

## 2018-07-28 NOTE — Telephone Encounter (Signed)
Refill sent in

## 2018-08-05 DIAGNOSIS — J309 Allergic rhinitis, unspecified: Secondary | ICD-10-CM

## 2018-08-05 HISTORY — DX: Allergic rhinitis, unspecified: J30.9

## 2018-09-24 ENCOUNTER — Other Ambulatory Visit: Payer: Self-pay | Admitting: Cardiology

## 2018-09-24 MED ORDER — HYDRALAZINE HCL 25 MG PO TABS: 12.5000 mg | ORAL_TABLET | Freq: Three times a day (TID) | ORAL | 1 refills | Status: DC

## 2018-09-24 NOTE — Telephone Encounter (Signed)
°*  STAT* If patient is at the pharmacy, call can be transferred to refill team.   1. Which medications need to be refilled? (please list name of each medication and dose if known)  hydrALAZINE (APRESOLINE) 25 MG   2. Which pharmacy/location (including [place] and city if local pharmacy) is medication to be sent to?  [facility] [place], [address] AT [place] [phone] (Phone) [phone] (Fax)    3. Do they need a 30 day or 90 day supply? 90 day

## 2018-09-24 NOTE — Telephone Encounter (Signed)
Hydralazine refill being sent to [facility] on [address] In [place] per pt request

## 2018-09-29 ENCOUNTER — Other Ambulatory Visit: Payer: Self-pay | Admitting: Cardiology

## 2018-09-29 NOTE — Telephone Encounter (Signed)
Rx refill sent to pharmacy. 

## 2018-10-24 ENCOUNTER — Other Ambulatory Visit: Payer: Self-pay | Admitting: Cardiology

## 2018-12-05 ENCOUNTER — Other Ambulatory Visit: Payer: Self-pay | Admitting: Cardiology

## 2018-12-08 NOTE — Telephone Encounter (Signed)
Plavix refill sent to [facility]. Requires office visit for further refills

## 2018-12-31 ENCOUNTER — Other Ambulatory Visit: Payer: Self-pay | Admitting: Cardiology

## 2019-01-02 ENCOUNTER — Other Ambulatory Visit: Payer: Self-pay | Admitting: Cardiology

## 2019-01-06 ENCOUNTER — Telehealth: Payer: Self-pay | Admitting: Cardiology

## 2019-01-06 NOTE — Telephone Encounter (Signed)
°  Patient has appt but could not get in until October..   1. Which medications need to be refilled? (please list name of each medication and dose if known) Clopidogrel  2. Which pharmacy/location (including [place] and city if local pharmacy) is medication to be sent to?[facility]  3. Do they need a 30 day or 90 day supply? [place]

## 2019-01-07 MED ORDER — CLOPIDOGREL BISULFATE 75 MG PO TABS: 75.0000 mg | ORAL_TABLET | Freq: Every day | ORAL | 0 refills | Status: DC

## 2019-01-07 NOTE — Telephone Encounter (Signed)
Refill for clopidogrel sent to [facility] as requested.

## 2019-01-08 ENCOUNTER — Other Ambulatory Visit: Payer: Self-pay | Admitting: Cardiology

## 2019-02-06 ENCOUNTER — Other Ambulatory Visit: Payer: Self-pay | Admitting: Cardiology

## 2019-02-16 ENCOUNTER — Other Ambulatory Visit: Payer: Self-pay | Admitting: Cardiology

## 2019-02-16 NOTE — Telephone Encounter (Signed)
Carvedilol refill sent to [facility] 

## 2019-02-24 NOTE — Progress Notes (Signed)
Cardiology Office Note:    Date:  02/26/2019   ID:  [name], DOB 1941/04/28, MRN [id number]  PCP:  Algis Greenhouse, MD  Cardiologist:  Shirlee More, MD    Referring MD: Algis Greenhouse, MD    ASSESSMENT:    1. Coronary artery disease of native artery of native heart with stable angina pectoris ([place])   2. Benign essential hypertension   3. Familial combined hyperlipidemia    PLAN:    In order of problems listed above:  1. Stable CAD after PCI no angina on current medical treatment continue long-term clopidogrel beta-blocker and statin. 2. Poorly controlled hypertension increase his hydralazine record daily twice 2 weeks and reassess in 2 weeks in the office 3. Stable continue statin check labs liver function lipid profile for safety efficacy   Next appointment: 2 weeks BP check   Medication Adjustments/Labs and Tests Ordered: Current medicines are reviewed at length with the patient today.  Concerns regarding medicines are outlined above.  Orders Placed This Encounter  Procedures  . Comp Met (CMET)  . Lipid Profile   Meds ordered this encounter  Medications  . hydrALAZINE (APRESOLINE) 25 MG tablet    Sig: Take 1 tablet (25 mg total) by mouth 3 (three) times daily.    Dispense:  135 tablet    Refill:  1    Chief Complaint  Patient presents with  . Follow-up  . Coronary Artery Disease    History of Present Illness:    [name] is a 78 y.o. male with a hx of coronary artery disease with PCI 2014 drug-eluting stent to the LAD diagonal and right coronary artery as well as hypertension and dyslipidemia last seen 03/26/2018. Compliance with diet, lifestyle and medications: Yes  He perceives he is doing well and no complaints of shortness of breath edema chest pain palpitation or syncope.  He acknowledges his home blood pressure has been elevated. Past Medical History:  Diagnosis Date  . Arthritis   . Benign essential hypertension 04/05/2016  . CAD  (coronary artery disease), native coronary artery 04/05/2016   Overview:  Formatting of this note may be different from the original.      11/2012: DES of LAD, D2 and RCA   . Cancer ([place])   . Dysrhythmia    IRREG HEARTBEAT  . 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 Erectile dysfunction 04/05/2016  . 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 Hypertension   . 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 Jaundice    AGE 48  . Osteoarthritis of hip 07/21/2015  . 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 PONV (postoperative nausea and vomiting)   . 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   Overview:  2018  . Preoperative cardiovascular examination 05/05/2015  . 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 CARDIAC CATHETERIZATION    . CHOLECYSTECTOMY     2002 [place]  . COLON SURGERY     04/2012 [place]  . CORONARY ANGIOPLASTY     HIGH PT REGIONAL  2014  . HIP ARTHROPLASTY     RIGHT HIP  2012  . TOTAL HIP ARTHROPLASTY Left 07/21/2015   Procedure: LEFT TOTAL HIP ARTHROPLASTY ANTERIOR APPROACH;  Surgeon: Mcarthur Rossetti, MD;  Location: [facility];  Service: Orthopedics;  Laterality: Left;    Current Medications: Current Meds  Medication Sig  . Ascorbic Acid (VITAMIN C) 1000 MG tablet Take 1,000 mg by mouth daily.  . carvedilol (COREG) 3.125 MG tablet TAKE 1 TABLET TWICE DAILY  . clopidogrel (PLAVIX) 75 MG tablet Take 1 tablet (75 mg total) by mouth daily.  Marland Kitchen dutasteride (AVODART) 0.5 MG capsule Take 1 capsule by mouth daily.  . fluticasone (FLONASE) 50 MCG/ACT nasal spray Place 1 spray into both nostrils daily.  Marland Kitchen gabapentin (NEURONTIN) 100 MG capsule TAKE 1 CAPSULE BY MOUTH THREE TIMES A DAY FOR PAIN  . hydrALAZINE (APRESOLINE) 25 MG tablet Take 1 tablet (25 mg total) by mouth 3 (three) times daily.  Marland Kitchen loratadine (CLARITIN) 10 MG  tablet Take 10 mg by mouth daily.  . Multiple Vitamin (MULTIVITAMIN WITH MINERALS) TABS tablet Take 1 tablet by mouth daily.  . nitroGLYCERIN (NITROSTAT) 0.4 MG SL tablet Place 0.4 mg under the tongue every 5 (five) minutes as needed for chest pain.   . simvastatin (ZOCOR) 40 MG tablet TAKE 1 TABLET EVERY EVENING  ( APPOINTMENT IS NEEDED  )  . tamsulosin (FLOMAX) 0.4 MG CAPS capsule Take 0.4 mg by mouth 2 (two) times daily.   . vitamin C (ASCORBIC ACID) 500 MG tablet Take 500 mg by mouth daily.  . [DISCONTINUED] hydrALAZINE (APRESOLINE) 25 MG tablet Take 0.5 tablets (12.5 mg total) by mouth 3 (three) times daily.     Allergies:   Patient has no known allergies.   Social History   Socioeconomic History  . Marital status: Married    Spouse name: Not on file  . Number of children: Not on file  . Years of education: Not on file  . Highest education level: Not on file  Occupational History  . Not on file  Social Needs  . Financial resource strain: Not on file  . Food insecurity    Worry: Not on file    Inability: Not on file  . Transportation needs    Medical: Not on file    Non-medical: Not on file  Tobacco Use  . Smoking status: Former Smoker    Packs/day: 1.00    Types: Cigarettes    Quit date: 2000    Years since quitting: 20.7  . Smokeless tobacco: Never Used  Substance and Sexual Activity  . Alcohol use: No  . Drug use: No  . Sexual activity: Not on file  Lifestyle  . Physical activity    Days per week: Not on file    Minutes per session: Not on file  . Stress: Not on file  Relationships  . Social Herbalist on phone: Not on file    Gets together: Not on file    Attends religious service: Not on file    Active member of club or organization: Not on file    Attends meetings of clubs or organizations: Not on file    Relationship status: Not on file  Other Topics Concern  . Not on file  Social History Narrative  . Not on file     Family History: The  patient's family history includes Colon cancer in his mother; Heart disease in his father. ROS:   Please see the history of present illness.    All other systems reviewed and are negative.  EKGs/Labs/Other Studies Reviewed:    The following studies were reviewed t  Recent Labs: No results found for requested labs within last 8760 hours.  Recent Lipid Panel    Component Value Date/Time   CHOL  134 12/06/2017 1151   TRIG 133 12/06/2017 1151   HDL 48 12/06/2017 1151   LDLCALC 59 12/06/2017 1151    Physical Exam:    VS:  BP (!) 184/82 (BP Location: Right Arm, Patient Position: Sitting, Cuff Size: Normal)   Ht '6\' 1"'  (1.854 m)   Wt 215 lb 3.2 oz (97.6 kg)   SpO2 96%   BMI 28.39 kg/m     Wt Readings from Last 3 Encounters:  02/26/19 215 lb 3.2 oz (97.6 kg)  03/26/18 214 lb 3.2 oz (97.2 kg)  12/13/17 223 lb (101.2 kg)     GEN:  Well nourished, well developed in no acute distress HEENT: Normal NECK: No JVD; No carotid bruits LYMPHATICS: No lymphadenopathy CARDIAC: RRR, no murmurs, rubs, gallops RESPIRATORY:  Clear to auscultation without rales, wheezing or rhonchi  ABDOMEN: Soft, non-tender, non-distended MUSCULOSKELETAL:  No edema; No deformity  SKIN: Warm and dry NEUROLOGIC:  Alert and oriented x 3 PSYCHIATRIC:  Normal affect    Signed, Shirlee More, MD  02/26/2019 4:51 PM    [facility]

## 2019-02-26 ENCOUNTER — Other Ambulatory Visit: Payer: Self-pay

## 2019-02-26 ENCOUNTER — Encounter: Payer: Self-pay | Admitting: Cardiology

## 2019-02-26 ENCOUNTER — Ambulatory Visit (INDEPENDENT_AMBULATORY_CARE_PROVIDER_SITE_OTHER): Payer: Medicare HMO | Admitting: Cardiology

## 2019-02-26 VITALS — BP 184/82 | Ht 73.0 in | Wt 215.2 lb

## 2019-02-26 DIAGNOSIS — E7849 Other hyperlipidemia: Secondary | ICD-10-CM | POA: Diagnosis not present

## 2019-02-26 DIAGNOSIS — I10 Essential (primary) hypertension: Secondary | ICD-10-CM | POA: Diagnosis not present

## 2019-02-26 DIAGNOSIS — I25118 Atherosclerotic heart disease of native coronary artery with other forms of angina pectoris: Secondary | ICD-10-CM

## 2019-02-26 MED ORDER — HYDRALAZINE HCL 25 MG PO TABS: 25.0000 mg | ORAL_TABLET | Freq: Three times a day (TID) | ORAL | 1 refills | Status: DC

## 2019-02-26 NOTE — Patient Instructions (Signed)
Medication Instructions:  Your physician has recommended you make the following change in your medication:   INCREASE hydralazine (apresoline) 25 mg: Take 1 tablet three times daily   If you need a refill on your cardiac medications before your next appointment, please call your pharmacy.   Lab work: Your physician recommends that you return for lab work today: CMP, lipid panel.   If you have labs (blood work) drawn today and your tests are completely normal, you will receive your results only by: Marland Kitchen MyChart Message (if you have MyChart) OR . A paper copy in the mail If you have any lab test that is abnormal or we need to change your treatment, we will call you to review the results.  Testing/Procedures: None   Follow-Up: At [facility], you and your health needs are our priority.  As part of our continuing mission to provide you with exceptional heart care, we have created designated Provider Care Teams.  These Care Teams include your primary Cardiologist (physician) and Advanced Practice Providers (APPs -  Physician Assistants and Nurse Practitioners) who all work together to provide you with the care you need, when you need it. . You will need a follow up appointment in 2 weeks with Laurann Montana, NP in our [facility] office.   Any Other Special Instructions Will Be Listed Below (If Applicable). **Check your blood pressure twice daily at the same time every day and record these readings. Please bring your BP log with you to your next appointment for review.

## 2019-02-27 LAB — LIPID PANEL
Chol/HDL Ratio: 2.7 ratio (ref 0.0–5.0)
Cholesterol, Total: 137 mg/dL (ref 100–199)
HDL: 50 mg/dL (ref >39)
LDL Chol Calc (NIH): 49 mg/dL (ref 0–99)
Triglycerides: 240 mg/dL — ABNORMAL HIGH (ref 0–149)
VLDL Cholesterol Cal: 38 mg/dL (ref 5–40)

## 2019-02-27 LAB — COMPREHENSIVE METABOLIC PANEL
ALT: 13 IU/L (ref 0–44)
AST: 20 IU/L (ref 0–40)
Albumin/Globulin Ratio: 1.7 (ref 1.2–2.2)
Albumin: 3.9 g/dL (ref 3.7–4.7)
Alkaline Phosphatase: 74 IU/L (ref 39–117)
BUN/Creatinine Ratio: 16 (ref 10–24)
BUN: 16 mg/dL (ref 8–27)
Bilirubin Total: 0.3 mg/dL (ref 0.0–1.2)
CO2: 27 mmol/L (ref 20–29)
Calcium: 9.1 mg/dL (ref 8.6–10.2)
Chloride: 102 mmol/L (ref 96–106)
Creatinine, Ser: 1.03 mg/dL (ref 0.76–1.27)
GFR calc Af Amer: 82 mL/min/{1.73_m2} (ref >59)
GFR calc non Af Amer: 71 mL/min/{1.73_m2} (ref >59)
Globulin, Total: 2.3 g/dL (ref 1.5–4.5)
Glucose: 121 mg/dL — ABNORMAL HIGH (ref 65–99)
Potassium: 4.2 mmol/L (ref 3.5–5.2)
Sodium: 141 mmol/L (ref 134–144)
Total Protein: 6.2 g/dL (ref 6.0–8.5)

## 2019-03-02 NOTE — Addendum Note (Signed)
Addended by: Stevan Born on: 03/02/2019 08:11 AM   Modules accepted: Orders

## 2019-03-05 ENCOUNTER — Other Ambulatory Visit: Payer: Self-pay | Admitting: Cardiology

## 2019-03-06 ENCOUNTER — Other Ambulatory Visit: Payer: Self-pay | Admitting: Cardiology

## 2019-03-12 ENCOUNTER — Ambulatory Visit (INDEPENDENT_AMBULATORY_CARE_PROVIDER_SITE_OTHER): Payer: Medicare HMO | Admitting: Cardiology

## 2019-03-12 ENCOUNTER — Encounter: Payer: Self-pay | Admitting: Cardiology

## 2019-03-12 ENCOUNTER — Other Ambulatory Visit: Payer: Self-pay

## 2019-03-12 ENCOUNTER — Ambulatory Visit: Payer: Medicare HMO | Admitting: Family

## 2019-03-12 VITALS — BP 160/84 | HR 56 | Ht 73.0 in | Wt 214.0 lb

## 2019-03-12 DIAGNOSIS — I10 Essential (primary) hypertension: Secondary | ICD-10-CM

## 2019-03-12 MED ORDER — HYDROCHLOROTHIAZIDE 12.5 MG PO CAPS: 12.5000 mg | ORAL_CAPSULE | Freq: Every day | ORAL | 1 refills | Status: DC

## 2019-03-12 MED ORDER — NITROGLYCERIN 0.4 MG SL SUBL: 0.4000 mg | SUBLINGUAL_TABLET | SUBLINGUAL | 3 refills | Status: DC | PRN

## 2019-03-12 NOTE — Progress Notes (Signed)
59  Cardiology Office Note:    Date:  03/12/2019   ID:  [name], DOB 18-Mar-1941, MRN [id number]  PCP:  Algis Greenhouse, MD  Cardiologist:  No primary care provider on file.  Electrophysiologist:  None   Referring MD: Algis Greenhouse, MD   Chief Complaint  Patient presents with  . Follow-up    History of Present Illness:    [name] is a 78 y.o. male with a hx of coronary disease, hypertension, hyperlipidemia.  The patient is here for a follow-up visit for his untreated hypertension.  The patient last saw Dr. Bettina Gavia on February 26, 2019.  At which time his hydralazine dose was increased to 25 mg 3 times daily.  He states he has been taking his medication as prescribed.  Today he denies any chest pain, shortness of breath, nausea, vomiting.  He does have his records of his blood pressure recording that he takes at home.  His systolic blood pressure is averaging between 145-to 160s.  Past Medical History:  Diagnosis Date  . Arthritis   . Benign essential hypertension 04/05/2016  . CAD (coronary artery disease), native coronary artery 04/05/2016   Overview:  Formatting of this note may be different from the original.      11/2012: DES of LAD, D2 and RCA   . Cancer ([place])   . Dysrhythmia    [facility] HEART[facility]AT  . 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 Erectile dysfunction 04/05/2016  . 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 Hypertension   . 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 Jaundice    AGE 31  . Osteoarthritis of hip 07/21/2015  . 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 PONV (postoperative nausea and vomiting)   . 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   Overview:  2018  . Preoperative cardiovascular examination 05/05/2015  . 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 CARDIAC CATHETERIZATION    . CHOLECYSTECTOMY      2002 [place]  . COLON SURGERY     04/2012 [place]  . CORONARY ANGIOPLASTY     [facility]  2014  . HIP ARTHROPLASTY     RIGHT HIP  2012  . TOTAL HIP ARTHROPLASTY Left 07/21/2015   Procedure: LEFT TOTAL HIP ARTHROPLASTY ANTERIOR APPROACH;  Surgeon: Mcarthur Rossetti, MD;  Location: [place];  Service: Orthopedics;  Laterality: Left;    Current Medications: Current Meds  Medication Sig  . Ascorbic Acid (VITAMIN C) 1000 MG tablet Take 1,000 mg by mouth daily.  . carvedilol (COREG) 3.125 MG tablet TAKE 1 TABLET TWICE DAILY  . clopidogrel (PLAVIX) 75 MG tablet TAKE 1 TABLET EVERY DAY  . dutasteride (AVODART) 0.5 MG capsule Take 1 capsule by mouth daily.  . fluticasone (FLONASE) 50 MCG/ACT nasal spray Place 1 spray into both nostrils daily.  Marland Kitchen gabapentin (NEURONTIN) 100 MG capsule TAKE 1 CAPSULE BY MOUTH THREE TIMES A DAY FOR PAIN  . hydrALAZINE (APRESOLINE) 25 MG tablet Take 1 tablet (25 mg total) by mouth 3 (three) times daily.  Marland Kitchen loratadine (CLARITIN) 10 MG tablet Take 10 mg by mouth daily.  . Multiple Vitamin (MULTIVITAMIN WITH MINERALS) TABS tablet Take 1 tablet by mouth daily.  . nitroGLYCERIN (NITROSTAT) 0.4 MG SL tablet Place 1 tablet (0.4 mg total) under the tongue every 5 (five) minutes as  needed for chest pain.  . simvastatin (ZOCOR) 40 MG tablet TAKE 1 TABLET EVERY EVENING  ( APPOINTMENT IS NEEDED  )  . tamsulosin (FLOMAX) 0.4 MG CAPS capsule Take 0.4 mg by mouth 2 (two) times daily.   . vitamin C (ASCORBIC ACID) 500 MG tablet Take 500 mg by mouth daily.  . [DISCONTINUED] nitroGLYCERIN (NITROSTAT) 0.4 MG SL tablet Place 0.4 mg under the tongue every 5 (five) minutes as needed for chest pain.      Allergies:   Patient has no known allergies.   Social History   Socioeconomic History  . Marital status: Married    Spouse name: Not on file  . Number of children: Not on file  . Years of education: Not on file  . Highest education level: Not on file   Occupational History  . Not on file  Social Needs  . Financial resource strain: Not on file  . Food insecurity    Worry: Not on file    Inability: Not on file  . Transportation needs    Medical: Not on file    Non-medical: Not on file  Tobacco Use  . Smoking status: Former Smoker    Packs/day: 1.00    Types: Cigarettes    Quit date: 2000    Years since quitting: 20.8  . Smokeless tobacco: Never Used  Substance and Sexual Activity  . Alcohol use: No  . Drug use: No  . Sexual activity: Not on file  Lifestyle  . Physical activity    Days per week: Not on file    Minutes per session: Not on file  . Stress: Not on file  Relationships  . Social Herbalist on phone: Not on file    Gets together: Not on file    Attends religious service: Not on file    Active member of club or organization: Not on file    Attends meetings of clubs or organizations: Not on file    Relationship status: Not on file  Other Topics Concern  . Not on file  Social History Narrative  . Not on file     Family History: The patient's family history includes Colon cancer in his mother; Heart disease in his father.  ROS:   Review of Systems  Constitution: Negative for decreased appetite, fever and weight gain.  HENT: Negative for congestion, ear discharge, hoarse voice and sore throat.   Eyes: Negative for discharge, redness, vision loss in right eye and visual halos.  Cardiovascular: Negative for chest pain, dyspnea on exertion, leg swelling, orthopnea and palpitations.  Respiratory: Negative for cough, hemoptysis, shortness of breath and snoring.   Endocrine: Negative for heat intolerance and polyphagia.  Hematologic/Lymphatic: Negative for bleeding problem. Does not bruise/bleed easily.  Skin: Negative for flushing, nail changes, rash and suspicious lesions.  Musculoskeletal: Negative for arthritis, joint pain, muscle cramps, myalgias, neck pain and stiffness.  Gastrointestinal: Negative  for abdominal pain, bowel incontinence, diarrhea and excessive appetite.  Genitourinary: Negative for decreased libido, genital sores and incomplete emptying.  Neurological: Negative for brief paralysis, focal weakness, headaches and loss of balance.  Psychiatric/Behavioral: Negative for altered mental status, depression and suicidal ideas.  Allergic/Immunologic: Negative for HIV exposure and persistent infections.    EKGs/Labs/Other Studies Reviewed:    The following studies were reviewed today:   EKG: None today.  Recent Labs: 02/26/2019: ALT 13; BUN 16; Creatinine, Ser 1.03; Potassium 4.2; Sodium 141  Recent Lipid Panel    Component  Value Date/Time   CHOL 137 02/26/2019 1553   TRIG 240 (H) 02/26/2019 1553   HDL 50 02/26/2019 1553   CHOLHDL 2.7 02/26/2019 1553   LDLCALC 49 02/26/2019 1553    Physical Exam:    VS:  BP (!) 160/84 (BP Location: Right Arm, Patient Position: Sitting, Cuff Size: Normal)   Pulse (!) 56   Ht 6\' 1"  (1.854 m)   Wt 214 lb (97.1 kg)   SpO2 98%   BMI 28.23 kg/m     Wt Readings from Last 3 Encounters:  03/12/19 214 lb (97.1 kg)  02/26/19 215 lb 3.2 oz (97.6 kg)  03/26/18 214 lb 3.2 oz (97.2 kg)     GEN: Well nourished, well developed in no acute distress HEENT: Normal NECK: No JVD; No carotid bruits LYMPHATICS: No lymphadenopathy CARDIAC: S1S2 noted,RRR, no murmurs, rubs, gallops RESPIRATORY:  Clear to auscultation without rales, wheezing or rhonchi  ABDOMEN: Soft, non-tender, non-distended, +bowel sounds, no guarding. EXTREMITIES: No edema, No cyanosis, no clubbing MUSCULOSKELETAL:  No edema; No deformity  SKIN: Warm and dry NEUROLOGIC:  Alert and oriented x 3, non-focal PSYCHIATRIC:  Normal affect, good insight  ASSESSMENT:    1. Uncontrolled hypertension    PLAN:    1.  Also will review the patient home blood pressures with him, which show he is not well controlled at home systolic blood pressure between 145-160s.  He is currently  on carvedilol 3.125 mg twice daily along with hydralazine 25 mg 3 times a day.  His heart rate would not allow for increased dose for carvedilol.  At this time I am going to add hydrochlorothiazide 12.5 mg daily.  He will again take his blood pressure daily and follow-up in 2 weeks.  He is not controlled on this regimen and need for additional antihypertensive I would favor dropping the carvedilol and starting him on ARB.  The patient is in agreement with the above plan. The patient left the office in stable condition.  The patient will follow up in 2 weeks   Medication Adjustments/Labs and Tests Ordered: Current medicines are reviewed at length with the patient today.  Concerns regarding medicines are outlined above.  No orders of the defined types were placed in this encounter.  Meds ordered this encounter  Medications  . nitroGLYCERIN (NITROSTAT) 0.4 MG SL tablet    Sig: Place 1 tablet (0.4 mg total) under the tongue every 5 (five) minutes as needed for chest pain.    Dispense:  30 tablet    Refill:  3  . hydrochlorothiazide (MICROZIDE) 12.5 MG capsule    Sig: Take 1 capsule (12.5 mg total) by mouth daily.    Dispense:  90 capsule    Refill:  1    Patient Instructions  Medication Instructions:  Your physician has recommended you make the following change in your medication:   START: HYDROCHLOROTHIAZIDE(HCTZ) 12.5 mg Take 1 tab daily Your Nitro was refilled   *If you need a refill on your cardiac medications before your next appointment, please call your pharmacy*  Lab Work: None If you have labs (blood work) drawn today and your tests are completely normal, you will receive your results only by: Marland Kitchen MyChart Message (if you have MyChart) OR . A paper copy in the mail If you have any lab test that is abnormal or we need to change your treatment, we will call you to review the results.  Testing/Procedures: NOne  Follow-Up: At [facility], you and your health needs are  our  priority.  As part of our continuing mission to provide you with exceptional heart care, we have created designated Provider Care Teams.  These Care Teams include your primary Cardiologist (physician) and Advanced Practice Providers (APPs -  Physician Assistants and Nurse Practitioners) who all work together to provide you with the care you need, when you need it.  Your next appointment:   2 weeks  The format for your next appointment:   In Person  Provider:   Berniece Salines, DO  Other Instructions  Hydrochlorothiazide, HCTZ capsules or tablets What is this medicine? HYDROCHLOROTHIAZIDE (hye droe klor oh THYE a zide) is a diuretic. It increases the amount of urine passed, which causes the body to lose salt and water. This medicine is used to treat high blood pressure. It is also reduces the swelling and water retention caused by various medical conditions, such as heart, liver, or kidney disease. This medicine may be used for other purposes; ask your health care provider or pharmacist if you have questions. COMMON BRAND NAME(S): Esidrix, Ezide, HydroDIURIL, Microzide, Oretic, Zide What should I tell my health care provider before I take this medicine? They need to know if you have any of these conditions:  diabetes  gout  immune system problems, like lupus  kidney disease or kidney stones  liver disease  pancreatitis  small amount of urine or difficulty passing urine  an unusual or allergic reaction to hydrochlorothiazide, sulfa drugs, other medicines, foods, dyes, or preservatives  pregnant or trying to get pregnant  breast-feeding How should I use this medicine? Take this medicine by mouth with a glass of water. Follow the directions on the prescription label. Take your medicine at regular intervals. Remember that you will need to pass urine frequently after taking this medicine. Do not take your doses at a time of day that will cause you problems. Do not stop taking your  medicine unless your doctor tells you to. Talk to your pediatrician regarding the use of this medicine in children. Special care may be needed. Overdosage: If you think you have taken too much of this medicine contact a poison control center or emergency room at once. NOTE: This medicine is only for you. Do not share this medicine with others. What if I miss a dose? If you miss a dose, take it as soon as you can. If it is almost time for your next dose, take only that dose. Do not take double or extra doses. What may interact with this medicine?  cholestyramine  colestipol  digoxin  dofetilide  lithium  medicines for blood pressure  medicines for diabetes  medicines that relax muscles for surgery  other diuretics  steroid medicines like prednisone or cortisone This list may not describe all possible interactions. Give your health care provider a list of all the medicines, herbs, non-prescription drugs, or dietary supplements you use. Also tell them if you smoke, drink alcohol, or use illegal drugs. Some items may interact with your medicine. What should I watch for while using this medicine? Visit your doctor or health care professional for regular checks on your progress. Check your blood pressure as directed. Ask your doctor or health care professional what your blood pressure should be and when you should contact him or her. You may need to be on a special diet while taking this medicine. Ask your doctor. Check with your doctor or health care professional if you get an attack of severe diarrhea, nausea and vomiting, or if you  sweat a lot. The loss of too much body fluid can make it dangerous for you to take this medicine. You may get drowsy or dizzy. Do not drive, use machinery, or do anything that needs mental alertness until you know how this medicine affects you. Do not stand or sit up quickly, especially if you are an older patient. This reduces the risk of dizzy or fainting  spells. Alcohol may interfere with the effect of this medicine. Avoid alcoholic drinks. This medicine may increase blood sugar. Ask your healthcare provider if changes in diet or medicines are needed if you have diabetes. This medicine can make you more sensitive to the sun. Keep out of the sun. If you cannot avoid being in the sun, wear protective clothing and use sunscreen. Do not use sun lamps or tanning beds/booths. What side effects may I notice from receiving this medicine? Side effects that you should report to your doctor or health care professional as soon as possible:  allergic reactions such as skin rash or itching, hives, swelling of the lips, mouth, tongue, or throat  changes in vision  chest pain  eye pain  fast or irregular heartbeat  feeling faint or lightheaded, falls  gout attack  muscle pain or cramps  pain or difficulty when passing urine  pain, tingling, numbness in the hands or feet  redness, blistering, peeling or loosening of the skin, including inside the mouth   signs and symptoms of high blood sugar such as being more thirsty or hungry or having to urinate more than normal. You may also feel very tired or have blurry vision.  unusually weak Side effects that usually do not require medical attention (report to your doctor or health care professional if they continue or are bothersome):  change in sex drive or performance  dry mouth  headache  stomach upset This list may not describe all possible side effects. Call your doctor for medical advice about side effects. You may report side effects to FDA at 1-800-FDA-1088. Where should I keep my medicine? Keep out of the reach of children. Store at room temperature between 15 and 30 degrees C (59 and 86 degrees F). Do not freeze. Protect from light and moisture. Keep container closed tightly. Throw away any unused medicine after the expiration date. NOTE: This sheet is a summary. It may not cover all  possible information. If you have questions about this medicine, talk to your doctor, pharmacist, or health care provider.  2020 Elsevier/Gold Standard (2018-02-26 09:25:06)      Adopting a Healthy Lifestyle.  Know what a healthy weight is for you (roughly BMI <25) and aim to maintain this   Aim for 7+ servings of fruits and vegetables daily   65-80+ fluid ounces of water or unsweet tea for healthy kidneys   Limit to max 1 drink of alcohol per day; avoid smoking/tobacco   Limit animal fats in diet for cholesterol and heart health - choose grass fed whenever available   Avoid highly processed foods, and foods high in saturated/trans fats   Aim for low stress - take time to unwind and care for your mental health   Aim for 150 min of moderate intensity exercise weekly for heart health, and weights twice weekly for bone health   Aim for 7-9 hours of sleep daily   When it comes to diets, agreement about the perfect plan isnt easy to find, even among the experts. Experts at the [employer] developed an  idea known as the Healthy Eating Plate. Just imagine a plate divided into logical, healthy portions.   The emphasis is on diet quality:   Load up on vegetables and fruits - one-half of your plate: Aim for color and variety, and remember that potatoes dont count.   Go for whole grains - one-quarter of your plate: Whole wheat, barley, wheat berries, quinoa, oats, brown rice, and foods made with them. If you want pasta, go with whole wheat pasta.   Protein power - one-quarter of your plate: Fish, chicken, beans, and nuts are all healthy, versatile protein sources. Limit red meat.   The diet, however, does go beyond the plate, offering a few other suggestions.   Use healthy plant oils, such as olive, canola, soy, corn, sunflower and peanut. Check the labels, and avoid partially hydrogenated oil, which have unhealthy trans fats.   If youre thirsty, drink water.  Coffee and tea are good in moderation, but skip sugary drinks and limit milk and dairy products to one or two daily servings.   The type of carbohydrate in the diet is more important than the amount. Some sources of carbohydrates, such as vegetables, fruits, whole grains, and beans-are healthier than others.   Finally, stay active  Signed, Berniece Salines, DO  03/12/2019 11:39 AM    [place]

## 2019-03-12 NOTE — Patient Instructions (Signed)
Medication Instructions:  Your physician has recommended you make the following change in your medication:   START: HYDROCHLOROTHIAZIDE(HCTZ) 12.5 mg Take 1 tab daily Your Nitro was refilled   *If you need a refill on your cardiac medications before your next appointment, please call your pharmacy*  Lab Work: None If you have labs (blood work) drawn today and your tests are completely normal, you will receive your results only by: Marland Kitchen MyChart Message (if you have MyChart) OR . A paper copy in the mail If you have any lab test that is abnormal or we need to change your treatment, we will call you to review the results.  Testing/Procedures: NOne  Follow-Up: At [facility], you and your health needs are our priority.  As part of our continuing mission to provide you with exceptional heart care, we have created designated Provider Care Teams.  These Care Teams include your primary Cardiologist (physician) and Advanced Practice Providers (APPs -  Physician Assistants and Nurse Practitioners) who all work together to provide you with the care you need, when you need it.  Your next appointment:   2 weeks  The format for your next appointment:   In Person  Provider:   Berniece Salines, DO  Other Instructions  Hydrochlorothiazide, HCTZ capsules or tablets What is this medicine? HYDROCHLOROTHIAZIDE (hye droe klor oh THYE a zide) is a diuretic. It increases the amount of urine passed, which causes the body to lose salt and water. This medicine is used to treat high blood pressure. It is also reduces the swelling and water retention caused by various medical conditions, such as heart, liver, or kidney disease. This medicine may be used for other purposes; ask your health care provider or pharmacist if you have questions. COMMON BRAND NAME(S): Esidrix, Ezide, HydroDIURIL, Microzide, Oretic, Zide What should I tell my health care provider before I take this medicine? They need to know if you have  any of these conditions:  diabetes  gout  immune system problems, like lupus  kidney disease or kidney stones  liver disease  pancreatitis  small amount of urine or difficulty passing urine  an unusual or allergic reaction to hydrochlorothiazide, sulfa drugs, other medicines, foods, dyes, or preservatives  pregnant or trying to get pregnant  breast-feeding How should I use this medicine? Take this medicine by mouth with a glass of water. Follow the directions on the prescription label. Take your medicine at regular intervals. Remember that you will need to pass urine frequently after taking this medicine. Do not take your doses at a time of day that will cause you problems. Do not stop taking your medicine unless your doctor tells you to. Talk to your pediatrician regarding the use of this medicine in children. Special care may be needed. Overdosage: If you think you have taken too much of this medicine contact a poison control center or emergency room at once. NOTE: This medicine is only for you. Do not share this medicine with others. What if I miss a dose? If you miss a dose, take it as soon as you can. If it is almost time for your next dose, take only that dose. Do not take double or extra doses. What may interact with this medicine?  cholestyramine  colestipol  digoxin  dofetilide  lithium  medicines for blood pressure  medicines for diabetes  medicines that relax muscles for surgery  other diuretics  steroid medicines like prednisone or cortisone This list may not describe all possible interactions.  Give your health care provider a list of all the medicines, herbs, non-prescription drugs, or dietary supplements you use. Also tell them if you smoke, drink alcohol, or use illegal drugs. Some items may interact with your medicine. What should I watch for while using this medicine? Visit your doctor or health care professional for regular checks on your progress.  Check your blood pressure as directed. Ask your doctor or health care professional what your blood pressure should be and when you should contact him or her. You may need to be on a special diet while taking this medicine. Ask your doctor. Check with your doctor or health care professional if you get an attack of severe diarrhea, nausea and vomiting, or if you sweat a lot. The loss of too much body fluid can make it dangerous for you to take this medicine. You may get drowsy or dizzy. Do not drive, use machinery, or do anything that needs mental alertness until you know how this medicine affects you. Do not stand or sit up quickly, especially if you are an older patient. This reduces the risk of dizzy or fainting spells. Alcohol may interfere with the effect of this medicine. Avoid alcoholic drinks. This medicine may increase blood sugar. Ask your healthcare provider if changes in diet or medicines are needed if you have diabetes. This medicine can make you more sensitive to the sun. Keep out of the sun. If you cannot avoid being in the sun, wear protective clothing and use sunscreen. Do not use sun lamps or tanning beds/booths. What side effects may I notice from receiving this medicine? Side effects that you should report to your doctor or health care professional as soon as possible:  allergic reactions such as skin rash or itching, hives, swelling of the lips, mouth, tongue, or throat  changes in vision  chest pain  eye pain  fast or irregular heartbeat  feeling faint or lightheaded, falls  gout attack  muscle pain or cramps  pain or difficulty when passing urine  pain, tingling, numbness in the hands or feet  redness, blistering, peeling or loosening of the skin, including inside the mouth   signs and symptoms of high blood sugar such as being more thirsty or hungry or having to urinate more than normal. You may also feel very tired or have blurry vision.  unusually weak Side  effects that usually do not require medical attention (report to your doctor or health care professional if they continue or are bothersome):  change in sex drive or performance  dry mouth  headache  stomach upset This list may not describe all possible side effects. Call your doctor for medical advice about side effects. You may report side effects to FDA at 1-800-FDA-1088. Where should I keep my medicine? Keep out of the reach of children. Store at room temperature between 15 and 30 degrees C (59 and 86 degrees F). Do not freeze. Protect from light and moisture. Keep container closed tightly. Throw away any unused medicine after the expiration date. NOTE: This sheet is a summary. It may not cover all possible information. If you have questions about this medicine, talk to your doctor, pharmacist, or health care provider.  2020 Elsevier/Gold Standard (2018-02-26 09:25:06)

## 2019-03-16 ENCOUNTER — Telehealth: Payer: Self-pay | Admitting: Cardiology

## 2019-03-16 ENCOUNTER — Other Ambulatory Visit: Payer: Self-pay | Admitting: *Deleted

## 2019-03-16 MED ORDER — HYDRALAZINE HCL 25 MG PO TABS: 25.0000 mg | ORAL_TABLET | Freq: Three times a day (TID) | ORAL | 1 refills | Status: DC

## 2019-03-16 NOTE — Telephone Encounter (Signed)
°*  STAT* If patient is at the pharmacy, call can be transferred to refill team.   1. Which medications need to be refilled? (please list name of each medication and dose if known)Hydralazine   2. Which pharmacy/location (including [place] and city if local pharmacy) is medication to be sent to? [facility] on [address]   3. Do they need a 30 day or 90 day supply?[place]!!!

## 2019-03-16 NOTE — Telephone Encounter (Signed)
Called in earlier today. Please see previous encounter

## 2019-03-16 NOTE — Telephone Encounter (Signed)
Call hydralazine to [facility] on [address]

## 2019-03-16 NOTE — Telephone Encounter (Signed)
Refill sent in

## 2019-03-16 NOTE — Telephone Encounter (Signed)
PT states pharmacy did not receive reill. Called in new script to [facility] [address]

## 2019-03-17 ENCOUNTER — Other Ambulatory Visit: Payer: Self-pay | Admitting: *Deleted

## 2019-03-17 MED ORDER — HYDRALAZINE HCL 25 MG PO TABS: 25.0000 mg | ORAL_TABLET | Freq: Three times a day (TID) | ORAL | 1 refills | Status: DC

## 2019-03-18 DIAGNOSIS — D125 Benign neoplasm of sigmoid colon: Secondary | ICD-10-CM

## 2019-03-18 HISTORY — DX: Benign neoplasm of sigmoid colon: D12.5

## 2019-03-26 ENCOUNTER — Encounter: Payer: Self-pay | Admitting: Cardiology

## 2019-03-26 ENCOUNTER — Other Ambulatory Visit: Payer: Self-pay

## 2019-03-26 ENCOUNTER — Ambulatory Visit (INDEPENDENT_AMBULATORY_CARE_PROVIDER_SITE_OTHER): Payer: Medicare HMO | Admitting: Cardiology

## 2019-03-26 VITALS — BP 150/90 | HR 63 | Ht 72.0 in | Wt 212.0 lb

## 2019-03-26 DIAGNOSIS — I10 Essential (primary) hypertension: Secondary | ICD-10-CM

## 2019-03-26 DIAGNOSIS — I25118 Atherosclerotic heart disease of native coronary artery with other forms of angina pectoris: Secondary | ICD-10-CM | POA: Diagnosis not present

## 2019-03-26 MED ORDER — POTASSIUM CHLORIDE CRYS ER 20 MEQ PO TBCR: EXTENDED_RELEASE_TABLET | ORAL | 1 refills | Status: DC

## 2019-03-26 MED ORDER — FUROSEMIDE 40 MG PO TABS: ORAL_TABLET | ORAL | 1 refills | Status: DC

## 2019-03-26 NOTE — Patient Instructions (Signed)
Medication Instructions:  Your physician has recommended you make the following change in your medication:   START: Furosemide 40 mg Take 1 tab on Mon Wed And Fridays START: Potassium 20 meq Take 1 tab on Mon Wed and Fridays  *If you need a refill on your cardiac medications before your next appointment, please call your pharmacy*  Lab Work: Your physician recommends that you return for lab work in:   Before next visit: BMP,Magnesium  If you have labs (blood work) drawn today and your tests are completely normal, you will receive your results only by: Marland Kitchen MyChart Message (if you have MyChart) OR . A paper copy in the mail If you have any lab test that is abnormal or we need to change your treatment, we will call you to review the results.  Testing/Procedures: None  Follow-Up: At [facility], you and your health needs are our priority.  As part of our continuing mission to provide you with exceptional heart care, we have created designated Provider Care Teams.  These Care Teams include your primary Cardiologist (physician) and Advanced Practice Providers (APPs -  Physician Assistants and Nurse Practitioners) who all work together to provide you with the care you need, when you need it.  Your next appointment:   1 month  The format for your next appointment:   In Person  Provider:   Berniece Salines, DO  Other Instructions

## 2019-03-26 NOTE — Progress Notes (Signed)
Cardiology Office Note:    Date:  03/26/2019   ID:  [name], DOB 04-09-1941, MRN [id number]  PCP:  Algis Greenhouse, MD  Cardiologist:  Berniece Salines, DO  Electrophysiologist:  None   Referring MD: Algis Greenhouse, MD   Follow-up visit  History of Present Illness:    [name] is a 78 y.o. male with a hx of  hx of coronary artery disease with PCI 2014 drug-eluting stent to the LAD diagonal and right coronary artery as well as hypertension and dyslipidemia .   I did see the patient on 03/12/2019 at that time he was hypertensive and at that time I did add hydrochlorothiazide 12.5 mg daily to his medication regimen.  He was asked to take his blood pressure daily and he presents with this information to his follow-up visit.  Patient is here with his wife he denies any chest pain, shortness of breath, nausea, vomiting.  Past Medical History:  Diagnosis Date  . Arthritis   . Benign essential hypertension 04/05/2016  . CAD (coronary artery disease), native coronary artery 04/05/2016   Overview:  Formatting of this note may be different from the original.      11/2012: DES of LAD, D2 and RCA   . Cancer ([place])   . Dysrhythmia    IRREG HEARTBEAT  . 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 Erectile dysfunction 04/05/2016  . 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 Hypertension   . 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 Jaundice    AGE 57  . Osteoarthritis of hip 07/21/2015  . 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 PONV (postoperative nausea and vomiting)   . 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   Overview:  2018  . Preoperative cardiovascular examination 05/05/2015  . 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 CARDIAC CATHETERIZATION    . CHOLECYSTECTOMY     2002 [place]  . COLON SURGERY     04/2012 [place]   . CORONARY ANGIOPLASTY     [facility]  2014  . HIP ARTHROPLASTY     RIGHT HIP  2012  . TOTAL HIP ARTHROPLASTY Left 07/21/2015   Procedure: LEFT TOTAL HIP ARTHROPLASTY ANTERIOR APPROACH;  Surgeon: Mcarthur Rossetti, MD;  Location: [place];  Service: Orthopedics;  Laterality: Left;    Current Medications: Current Meds  Medication Sig  . Ascorbic Acid (VITAMIN C) 1000 MG tablet Take 1,000 mg by mouth daily.  . carvedilol (COREG) 3.125 MG tablet TAKE 1 TABLET TWICE DAILY  . clopidogrel (PLAVIX) 75 MG tablet TAKE 1 TABLET EVERY DAY  . dutasteride (AVODART) 0.5 MG capsule Take 1 capsule by mouth daily.  . fluticasone (FLONASE) 50 MCG/ACT nasal spray Place 1 spray into both nostrils daily.  Marland Kitchen gabapentin (NEURONTIN) 100 MG capsule TAKE 1 CAPSULE BY MOUTH THREE TIMES A DAY FOR PAIN  . hydrALAZINE (APRESOLINE) 25 MG tablet Take 1 tablet (25 mg total) by mouth 3 (three) times daily.  . hydrochlorothiazide (MICROZIDE) 12.5 MG capsule Take 1 capsule (12.5 mg total) by mouth daily.  . Multiple Vitamin (MULTIVITAMIN WITH MINERALS) TABS tablet Take 1 tablet by mouth daily.  . nitroGLYCERIN (NITROSTAT) 0.4 MG SL tablet Place 1 tablet (0.4 mg total) under the tongue every 5 (five) minutes as needed for chest pain.  . simvastatin (ZOCOR) 40 MG  tablet TAKE 1 TABLET EVERY EVENING  ( APPOINTMENT IS NEEDED  )  . tamsulosin (FLOMAX) 0.4 MG CAPS capsule Take 0.4 mg by mouth 2 (two) times daily.      Allergies:   Patient has no known allergies.   Social History   Socioeconomic History  . Marital status: Married    Spouse name: Not on file  . Number of children: Not on file  . Years of education: Not on file  . Highest education level: Not on file  Occupational History  . Not on file  Social Needs  . Financial resource strain: Not on file  . Food insecurity    Worry: Not on file    Inability: Not on file  . Transportation needs    Medical: Not on file    Non-medical: Not on file  Tobacco  Use  . Smoking status: Former Smoker    Packs/day: 1.00    Types: Cigarettes    Quit date: 2000    Years since quitting: 20.8  . Smokeless tobacco: Never Used  Substance and Sexual Activity  . Alcohol use: No  . Drug use: No  . Sexual activity: Not on file  Lifestyle  . Physical activity    Days per week: Not on file    Minutes per session: Not on file  . Stress: Not on file  Relationships  . Social Herbalist on phone: Not on file    Gets together: Not on file    Attends religious service: Not on file    Active member of club or organization: Not on file    Attends meetings of clubs or organizations: Not on file    Relationship status: Not on file  Other Topics Concern  . Not on file  Social History Narrative  . Not on file     Family History: The patient's family history includes Colon cancer in his mother; Heart disease in his father.  ROS:   Review of Systems  Constitution: Negative for decreased appetite, fever and weight gain.  HENT: Negative for congestion, ear discharge, hoarse voice and sore throat.   Eyes: Negative for discharge, redness, vision loss in right eye and visual halos.  Cardiovascular: Negative for chest pain, dyspnea on exertion, leg swelling, orthopnea and palpitations.  Respiratory: Negative for cough, hemoptysis, shortness of breath and snoring.   Endocrine: Negative for heat intolerance and polyphagia.  Hematologic/Lymphatic: Negative for bleeding problem. Does not bruise/bleed easily.  Skin: Negative for flushing, nail changes, rash and suspicious lesions.  Musculoskeletal: Negative for arthritis, joint pain, muscle cramps, myalgias, neck pain and stiffness.  Gastrointestinal: Negative for abdominal pain, bowel incontinence, diarrhea and excessive appetite.  Genitourinary: Negative for decreased libido, genital sores and incomplete emptying.  Neurological: Negative for brief paralysis, focal weakness, headaches and loss of balance.   Psychiatric/Behavioral: Negative for altered mental status, depression and suicidal ideas.  Allergic/Immunologic: Negative for HIV exposure and persistent infections.    EKGs/Labs/Other Studies Reviewed:    The following studies were reviewed today:   EKG: None today  Recent Labs: 02/26/2019: ALT 13; BUN 16; Creatinine, Ser 1.03; Potassium 4.2; Sodium 141  Recent Lipid Panel    Component Value Date/Time   CHOL 137 02/26/2019 1553   TRIG 240 (H) 02/26/2019 1553   HDL 50 02/26/2019 1553   CHOLHDL 2.7 02/26/2019 1553   LDLCALC 49 02/26/2019 1553    Physical Exam:    VS:  BP (!) 150/90 (BP Location: Left Arm)  Pulse 63   Ht 6' (1.829 m)   Wt 212 lb (96.2 kg)   SpO2 96%   BMI 28.75 kg/m     Wt Readings from Last 3 Encounters:  03/26/19 212 lb (96.2 kg)  03/12/19 214 lb (97.1 kg)  02/26/19 215 lb 3.2 oz (97.6 kg)     GEN: Well nourished, well developed in no acute distress HEENT: Normal NECK: No JVD; No carotid bruits LYMPHATICS: No lymphadenopathy CARDIAC: S1S2 noted,RRR, no murmurs, rubs, gallops RESPIRATORY:  Clear to auscultation without rales, wheezing or rhonchi  ABDOMEN: Soft, non-tender, non-distended, +bowel sounds, no guarding. EXTREMITIES: No edema, No cyanosis, no clubbing MUSCULOSKELETAL:  No edema; No deformity  SKIN: Warm and dry NEUROLOGIC:  Alert and oriented x 3, non-focal PSYCHIATRIC:  Normal affect, good insight  ASSESSMENT:    1. Benign essential hypertension   2. Coronary artery disease of native artery of native heart with stable angina pectoris ([place])    PLAN:    1.  His blood pressure has improved compared to his last visit but not at target yet.  I reviewed the patient pressure at home his systolic averages between 130-150 mmHg. given his clinical exam does show a bilateral leg edema that is significant I am going to add Lasix 40 mg Monday Wednesday Fridays to his medication regimen which he will take with potassium chloride 20 mEq.  He  will follow-up in 1 month I am hoping that we can get him close to 130/80 by this time.  If this does not work to get his blood pressure close to target we will have to review his medication and may be an an ARB to his list.   The patient is in agreement with the above plan. The patient left the office in stable condition.  The patient will follow up in 1 month.   Medication Adjustments/Labs and Tests Ordered: Current medicines are reviewed at length with the patient today.  Concerns regarding medicines are outlined above.  Orders Placed This Encounter  Procedures  . Basic Metabolic Panel (BMET)  . Magnesium   Meds ordered this encounter  Medications  . furosemide (LASIX) 40 MG tablet    Sig: Take 1 tablet (40 mg) on Mon Wed and Fridays    Dispense:  45 tablet    Refill:  1  . potassium chloride SA (KLOR-CON M20) 20 MEQ tablet    Sig: Take 1 tab (20 meq) on Mon Wed and Fridays    Dispense:  45 tablet    Refill:  1    Patient Instructions  Medication Instructions:  Your physician has recommended you make the following change in your medication:   START: Furosemide 40 mg Take 1 tab on Mon Wed And Fridays START: Potassium 20 meq Take 1 tab on Mon Wed and Fridays  *If you need a refill on your cardiac medications before your next appointment, please call your pharmacy*  Lab Work: Your physician recommends that you return for lab work in:   Before next visit: BMP,Magnesium  If you have labs (blood work) drawn today and your tests are completely normal, you will receive your results only by: Marland Kitchen MyChart Message (if you have MyChart) OR . A paper copy in the mail If you have any lab test that is abnormal or we need to change your treatment, we will call you to review the results.  Testing/Procedures: None  Follow-Up: At [facility], you and your health needs are our priority.  As part  of our continuing mission to provide you with exceptional heart care, we have created  designated Provider Care Teams.  These Care Teams include your primary Cardiologist (physician) and Advanced Practice Providers (APPs -  Physician Assistants and Nurse Practitioners) who all work together to provide you with the care you need, when you need it.  Your next appointment:   1 month  The format for your next appointment:   In Person  Provider:   Berniece Salines, DO  Other Instructions      Adopting a Healthy Lifestyle.  Know what a healthy weight is for you (roughly BMI <25) and aim to maintain this   Aim for 7+ servings of fruits and vegetables daily   65-80+ fluid ounces of water or unsweet tea for healthy kidneys   Limit to max 1 drink of alcohol per day; avoid smoking/tobacco   Limit animal fats in diet for cholesterol and heart health - choose grass fed whenever available   Avoid highly processed foods, and foods high in saturated/trans fats   Aim for low stress - take time to unwind and care for your mental health   Aim for 150 min of moderate intensity exercise weekly for heart health, and weights twice weekly for bone health   Aim for 7-9 hours of sleep daily   When it comes to diets, agreement about the perfect plan isnt easy to find, even among the experts. Experts at the [employer] developed an idea known as the Healthy Eating Plate. Just imagine a plate divided into logical, healthy portions.   The emphasis is on diet quality:   Load up on vegetables and fruits - one-half of your plate: Aim for color and variety, and remember that potatoes dont count.   Go for whole grains - one-quarter of your plate: Whole wheat, barley, wheat berries, quinoa, oats, brown rice, and foods made with them. If you want pasta, go with whole wheat pasta.   Protein power - one-quarter of your plate: Fish, chicken, beans, and nuts are all healthy, versatile protein sources. Limit red meat.   The diet, however, does go beyond the plate, offering a few  other suggestions.   Use healthy plant oils, such as olive, canola, soy, corn, sunflower and peanut. Check the labels, and avoid partially hydrogenated oil, which have unhealthy trans fats.   If youre thirsty, drink water. Coffee and tea are good in moderation, but skip sugary drinks and limit milk and dairy products to one or two daily servings.   The type of carbohydrate in the diet is more important than the amount. Some sources of carbohydrates, such as vegetables, fruits, whole grains, and beans-are healthier than others.   Finally, stay active  Signed, Berniece Salines, DO  03/26/2019 12:54 PM    [place]

## 2019-04-23 DIAGNOSIS — Z20822 Contact with and (suspected) exposure to covid-19: Secondary | ICD-10-CM

## 2019-04-23 HISTORY — DX: Contact with and (suspected) exposure to covid-19: Z20.822

## 2019-04-24 ENCOUNTER — Ambulatory Visit: Payer: Medicare HMO | Admitting: Cardiology

## 2019-05-01 LAB — BASIC METABOLIC PANEL
BUN/Creatinine Ratio: 16 (ref 10–24)
BUN: 17 mg/dL (ref 8–27)
CO2: 27 mmol/L (ref 20–29)
Calcium: 9.4 mg/dL (ref 8.6–10.2)
Chloride: 100 mmol/L (ref 96–106)
Creatinine, Ser: 1.06 mg/dL (ref 0.76–1.27)
GFR calc Af Amer: 78 mL/min/{1.73_m2} (ref >59)
GFR calc non Af Amer: 68 mL/min/{1.73_m2} (ref >59)
Glucose: 71 mg/dL (ref 65–99)
Potassium: 4.2 mmol/L (ref 3.5–5.2)
Sodium: 140 mmol/L (ref 134–144)

## 2019-05-01 LAB — MAGNESIUM: Magnesium: 2.1 mg/dL (ref 1.6–2.3)

## 2019-05-04 ENCOUNTER — Encounter: Payer: Self-pay | Admitting: Cardiology

## 2019-05-04 ENCOUNTER — Other Ambulatory Visit: Payer: Self-pay

## 2019-05-04 ENCOUNTER — Ambulatory Visit (INDEPENDENT_AMBULATORY_CARE_PROVIDER_SITE_OTHER): Payer: Medicare HMO | Admitting: Cardiology

## 2019-05-04 VITALS — BP 168/82 | HR 68 | Ht 72.0 in | Wt 213.8 lb

## 2019-05-04 DIAGNOSIS — I10 Essential (primary) hypertension: Secondary | ICD-10-CM

## 2019-05-04 MED ORDER — CARVEDILOL 6.25 MG PO TABS: 6.2500 mg | ORAL_TABLET | Freq: Two times a day (BID) | ORAL | 3 refills | Status: DC

## 2019-05-04 MED ORDER — POTASSIUM CHLORIDE CRYS ER 20 MEQ PO TBCR: 20.0000 meq | EXTENDED_RELEASE_TABLET | Freq: Every day | ORAL | 1 refills | Status: DC

## 2019-05-04 MED ORDER — FUROSEMIDE 40 MG PO TABS: 40.0000 mg | ORAL_TABLET | Freq: Every day | ORAL | 1 refills | Status: DC

## 2019-05-04 NOTE — Progress Notes (Signed)
Cardiology Office Note:    Date:  05/04/2019   ID:  DEWEY CORDREY, DOB June 26, 1940, MRN [id number]  PCP:  Algis Greenhouse, MD  Cardiologist:  Berniece Salines, DO  Electrophysiologist:  None   Referring MD: Algis Greenhouse, MD   Follow up visit  History of Present Illness:    [name] is a 78 y.o. male with a hx of hx of coronary artery disease with PCI 2014 drug-eluting stent to the LAD diagonal and right coronary artery as well as hypertension and dyslipidemia.  He is here today for a follow up visit. He tells me that he has been taking his medication as prescribed.    Past Medical History:  Diagnosis Date  . Arthritis   . Benign essential hypertension 04/05/2016  . CAD (coronary artery disease), native coronary artery 04/05/2016   Overview:  Formatting of this note may be different from the original.      11/2012: DES of LAD, D2 and RCA   . Cancer ([place])   . Dysrhythmia    [facility]  . 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 Erectile dysfunction 04/05/2016  . 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 Hypertension   . 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 Jaundice    AGE 60  . Osteoarthritis of hip 07/21/2015  . 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 PONV (postoperative nausea and vomiting)   . 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   Overview:  2018  . Preoperative cardiovascular examination 05/05/2015  . 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 CARDIAC CATHETERIZATION    . CHOLECYSTECTOMY     2002 [place]  . COLON SURGERY     04/2012 [place]  . CORONARY ANGIOPLASTY     [facility]  2014  . HIP ARTHROPLASTY     RIGHT HIP  2012  . TOTAL HIP ARTHROPLASTY Left 07/21/2015   Procedure: LEFT TOTAL HIP ARTHROPLASTY ANTERIOR APPROACH;  Surgeon: Mcarthur Rossetti, MD;  Location: [facility];   Service: Orthopedics;  Laterality: Left;    Current Medications: Current Meds  Medication Sig  . Ascorbic Acid (VITAMIN C) 1000 MG tablet Take 1,000 mg by mouth daily.  . carvedilol (COREG) 3.125 MG tablet TAKE 1 TABLET TWICE DAILY  . clopidogrel (PLAVIX) 75 MG tablet TAKE 1 TABLET EVERY DAY  . dutasteride (AVODART) 0.5 MG capsule Take 1 capsule by mouth daily.  . fluticasone (FLONASE) 50 MCG/ACT nasal spray Place 1 spray into both nostrils daily.  . furosemide (LASIX) 40 MG tablet Take 1 tablet (40 mg) on Mon Wed and Fridays  . gabapentin (NEURONTIN) 100 MG capsule TAKE 1 CAPSULE BY MOUTH THREE TIMES A DAY FOR PAIN  . hydrALAZINE (APRESOLINE) 25 MG tablet Take 1 tablet (25 mg total) by mouth 3 (three) times daily.  . hydrochlorothiazide (MICROZIDE) 12.5 MG capsule Take 1 capsule (12.5 mg total) by mouth daily.  . Multiple Vitamin (MULTIVITAMIN WITH MINERALS) TABS tablet Take 1 tablet by mouth daily.  . nitroGLYCERIN (NITROSTAT) 0.4 MG SL tablet Place 1 tablet (0.4 mg total) under the tongue every 5 (five) minutes as needed for chest pain.  . potassium chloride SA (KLOR-CON M20) 20 MEQ tablet Take 1 tab (20 meq) on Mon Wed and Fridays     Allergies:   Patient has no known allergies.  Social History   Socioeconomic History  . Marital status: Married    Spouse name: Not on file  . Number of children: Not on file  . Years of education: Not on file  . Highest education level: Not on file  Occupational History  . Not on file  Tobacco Use  . Smoking status: Former Smoker    Packs/day: 1.00    Types: Cigarettes    Quit date: 2000    Years since quitting: 20.9  . Smokeless tobacco: Never Used  Substance and Sexual Activity  . Alcohol use: No  . Drug use: No  . Sexual activity: Not on file  Other Topics Concern  . Not on file  Social History Narrative  . Not on file   Social Determinants of Health   Financial Resource Strain:   . Difficulty of Paying Living Expenses: Not on  file  Food Insecurity:   . Worried About Charity fundraiser in the Last Year: Not on file  . Ran Out of Food in the Last Year: Not on file  Transportation Needs:   . Lack of Transportation (Medical): Not on file  . Lack of Transportation (Non-Medical): Not on file  Physical Activity:   . Days of Exercise per Week: Not on file  . Minutes of Exercise per Session: Not on file  Stress:   . Feeling of Stress : Not on file  Social Connections:   . Frequency of Communication with Friends and Family: Not on file  . Frequency of Social Gatherings with Friends and Family: Not on file  . Attends Religious Services: Not on file  . Active Member of Clubs or Organizations: Not on file  . Attends Archivist Meetings: Not on file  . Marital Status: Not on file     Family History: The patient's family history includes Colon cancer in his mother; Heart disease in his father.  ROS:   Review of Systems  Constitution: Negative for decreased appetite, fever and weight gain.  HENT: Negative for congestion, ear discharge, hoarse voice and sore throat.   Eyes: Negative for discharge, redness, vision loss in right eye and visual halos.  Cardiovascular: Negative for chest pain, dyspnea on exertion, leg swelling, orthopnea and palpitations.  Respiratory: Negative for cough, hemoptysis, shortness of breath and snoring.   Endocrine: Negative for heat intolerance and polyphagia.  Hematologic/Lymphatic: Negative for bleeding problem. Does not bruise/bleed easily.  Skin: Negative for flushing, nail changes, rash and suspicious lesions.  Musculoskeletal: Negative for arthritis, joint pain, muscle cramps, myalgias, neck pain and stiffness.  Gastrointestinal: Negative for abdominal pain, bowel incontinence, diarrhea and excessive appetite.  Genitourinary: Negative for decreased libido, genital sores and incomplete emptying.  Neurological: Negative for brief paralysis, focal weakness, headaches and loss  of balance.  Psychiatric/Behavioral: Negative for altered mental status, depression and suicidal ideas.  Allergic/Immunologic: Negative for HIV exposure and persistent infections.    EKGs/Labs/Other Studies Reviewed:    The following studies were reviewed today:   EKG:  None today.  Recent Labs: 02/26/2019: ALT 13 05/01/2019: BUN 17; Creatinine, Ser 1.06; Magnesium 2.1; Potassium 4.2; Sodium 140  Recent Lipid Panel    Component Value Date/Time   CHOL 137 02/26/2019 1553   TRIG 240 (H) 02/26/2019 1553   HDL 50 02/26/2019 1553   CHOLHDL 2.7 02/26/2019 1553   LDLCALC 49 02/26/2019 1553    Physical Exam:    VS:  BP (!) 168/82   Pulse 68   Ht 6' (1.829  m)   Wt 213 lb 12.8 oz (97 kg)   BMI 29.00 kg/m     Wt Readings from Last 3 Encounters:  05/04/19 213 lb 12.8 oz (97 kg)  03/26/19 212 lb (96.2 kg)  03/12/19 214 lb (97.1 kg)     GEN: Well nourished, well developed in no acute distress HEENT: Normal NECK: No JVD; No carotid bruits LYMPHATICS: No lymphadenopathy CARDIAC: S1S2 noted,RRR, no murmurs, rubs, gallops RESPIRATORY:  Clear to auscultation without rales, wheezing or rhonchi  ABDOMEN: Soft, non-tender, non-distended, +bowel sounds, no guarding. EXTREMITIES: Bilateral ankle edema no cyanosis, no clubbing MUSCULOSKELETAL:  ilateral ankle edema; No deformity  SKIN: Warm and dry NEUROLOGIC:  Alert and oriented x 3, non-focal PSYCHIATRIC:  Normal affect, good insight  ASSESSMENT:    1. Uncontrolled hypertension    PLAN:    His blood pressure is elevated today.  I am not sure if he is taking his medications appropriately.  Therefore I have asked the patient to bring his medications with him at the next visit which will be in 2 weeks so I can be able to compare his list with what we have and what he is actually taking.  For now I like to increase the Coreg to 6.25 mg daily, make his Lasix 20 mg daily.  He will remain on the hydralazine 25 mg 3 times a days,  hydrochlorothiazide 12.5 mg daily.  Follow-up in 2 weeks.    Daily the patient is in agreement with the above plan. The patient left the office in stable condition.  The patient will follow up in 2 weeks for blood pressure check.   Medication Adjustments/Labs and Tests Ordered: Current medicines are reviewed at length with the patient today.  Concerns regarding medicines are outlined above.  No orders of the defined types were placed in this encounter.  No orders of the defined types were placed in this encounter.   There are no Patient Instructions on file for this visit.   Adopting a Healthy Lifestyle.  Know what a healthy weight is for you (roughly BMI <25) and aim to maintain this   Aim for 7+ servings of fruits and vegetables daily   65-80+ fluid ounces of water or unsweet tea for healthy kidneys   Limit to max 1 drink of alcohol per day; avoid smoking/tobacco   Limit animal fats in diet for cholesterol and heart health - choose grass fed whenever available   Avoid highly processed foods, and foods high in saturated/trans fats   Aim for low stress - take time to unwind and care for your mental health   Aim for 150 min of moderate intensity exercise weekly for heart health, and weights twice weekly for bone health   Aim for 7-9 hours of sleep daily   When it comes to diets, agreement about the perfect plan isnt easy to find, even among the experts. Experts at the [employer] developed an idea known as the Healthy Eating Plate. Just imagine a plate divided into logical, healthy portions.   The emphasis is on diet quality:   Load up on vegetables and fruits - one-half of your plate: Aim for color and variety, and remember that potatoes dont count.   Go for whole grains - one-quarter of your plate: Whole wheat, barley, wheat berries, quinoa, oats, brown rice, and foods made with them. If you want pasta, go with whole wheat pasta.   Protein power -  one-quarter of your plate: Fish,  chicken, beans, and nuts are all healthy, versatile protein sources. Limit red meat.   The diet, however, does go beyond the plate, offering a few other suggestions.   Use healthy plant oils, such as olive, canola, soy, corn, sunflower and peanut. Check the labels, and avoid partially hydrogenated oil, which have unhealthy trans fats.   If youre thirsty, drink water. Coffee and tea are good in moderation, but skip sugary drinks and limit milk and dairy products to one or two daily servings.   The type of carbohydrate in the diet is more important than the amount. Some sources of carbohydrates, such as vegetables, fruits, whole grains, and beans-are healthier than others.   Finally, stay active  Signed, Berniece Salines, DO  05/04/2019 12:16 PM    [facility]

## 2019-05-04 NOTE — Patient Instructions (Signed)
Medication Instructions:  Your physician has recommended you make the following change in your medication: de to 40 mg Take 1 tab   INCREASE: Furosemide to 40 mg Take 1 tab daily INCREASE: Potassium to 20 meq Take 1 tab daily INCREASE: Carvedilol to 6.25 mg Take 1 tab twice daily  *If you need a refill on your cardiac medications before your next appointment, please call your pharmacy*  Lab Work: None If you have labs (blood work) drawn today and your tests are completely normal, you will receive your results only by: Marland Kitchen MyChart Message (if you have MyChart) OR . A paper copy in the mail If you have any lab test that is abnormal or we need to change your treatment, we will call you to review the results.  Testing/Procedures: None  Follow-Up: At [facility], you and your health needs are our priority.  As part of our continuing mission to provide you with exceptional heart care, we have created designated Provider Care Teams.  These Care Teams include your primary Cardiologist (physician) and Advanced Practice Providers (APPs -  Physician Assistants and Nurse Practitioners) who all work together to provide you with the care you need, when you need it.  Your next appointment:   2 week(s)  The format for your next appointment:   In Person  Provider:   Berniece Salines, DO  Other Instructions

## 2019-05-05 ENCOUNTER — Telehealth: Payer: Self-pay | Admitting: Cardiology

## 2019-05-05 NOTE — Telephone Encounter (Signed)
Telephone call to patient. Informed of lab results.

## 2019-05-05 NOTE — Telephone Encounter (Signed)
Please call patient with results of labwork.Marland Kitchen

## 2019-05-11 ENCOUNTER — Other Ambulatory Visit: Payer: Self-pay | Admitting: Cardiology

## 2019-05-18 ENCOUNTER — Other Ambulatory Visit: Payer: Self-pay

## 2019-05-18 ENCOUNTER — Encounter: Payer: Self-pay | Admitting: Cardiology

## 2019-05-18 ENCOUNTER — Ambulatory Visit (INDEPENDENT_AMBULATORY_CARE_PROVIDER_SITE_OTHER): Payer: Medicare HMO | Admitting: Cardiology

## 2019-05-18 VITALS — BP 170/76 | HR 66 | Ht 72.0 in | Wt 212.0 lb

## 2019-05-18 DIAGNOSIS — I10 Essential (primary) hypertension: Secondary | ICD-10-CM | POA: Diagnosis not present

## 2019-05-18 MED ORDER — HYDRALAZINE HCL 50 MG PO TABS: 50.0000 mg | ORAL_TABLET | Freq: Three times a day (TID) | ORAL | 1 refills | Status: DC

## 2019-05-18 NOTE — Progress Notes (Signed)
Cardiology Office Note:    Date:  05/18/2019   ID:  [name], DOB 1941-01-18, MRN [id number]  PCP:  Algis Greenhouse, MD  Cardiologist:  Berniece Salines, DO  Electrophysiologist:  None   Referring MD: Algis Greenhouse, MD   Follow-up for hypertension  History of Present Illness:    [name] a 78 y.o.malewith a hx of hx of coronary artery disease with PCI 2014 drug-eluting stent to the LAD diagonal and right coronary artery as well as hypertension and dyslipidemia.  The patient was seen on May 02, 2019, at which time he was hypertensive in the office I did increase his carvedilol to 6.25 mg twice daily.  His Lasix was also changed to 20 mg daily he was remain on his hydralazine 25 mg 3 times a day and hydrochlorothiazide 12.5 mg.  He was asked at that time to bring his occasion with him as to be able to review it together.   He is here today for follow-up visit.  He offers no complaints.  His wife was on the phone during our visit  Past Medical History:  Diagnosis Date  . Arthritis   . Benign essential hypertension 04/05/2016  . CAD (coronary artery disease), native coronary artery 04/05/2016   Overview:  Formatting of this note may be different from the original.      11/2012: DES of LAD, D2 and RCA   . Cancer ([place])   . Dysrhythmia    [facility]  . 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 Erectile dysfunction 04/05/2016  . 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 Hypertension   . 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 Jaundice    AGE 75  . Osteoarthritis of hip 07/21/2015  . 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 PONV (postoperative nausea and vomiting)   . 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   Overview:  2018  . Preoperative cardiovascular examination 05/05/2015  . 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 CARDIAC CATHETERIZATION    . CHOLECYSTECTOMY     2002 [place]  . COLON SURGERY     04/2012 [place]  . CORONARY ANGIOPLASTY     [facility]  2014  . HIP ARTHROPLASTY     RIGHT HIP  2012  . TOTAL HIP ARTHROPLASTY Left 07/21/2015   Procedure: LEFT TOTAL HIP ARTHROPLASTY ANTERIOR APPROACH;  Surgeon: Mcarthur Rossetti, MD;  Location: [facility];  Service: Orthopedics;  Laterality: Left;    Current Medications: Current Meds  Medication Sig  . Ascorbic Acid (VITAMIN C) 1000 MG tablet Take 1,000 mg by mouth daily.  . carvedilol (COREG) 6.25 MG tablet Take 1 tablet (6.25 mg total) by mouth 2 (two) times daily.  . clopidogrel (PLAVIX) 75 MG tablet TAKE 1 TABLET EVERY DAY  . dutasteride (AVODART) 0.5 MG capsule Take 1 capsule by mouth daily.  . fluticasone (FLONASE) 50 MCG/ACT nasal spray Place 1 spray into both nostrils daily.  . furosemide (LASIX) 40 MG tablet Take 1 tablet (40 mg total) by mouth daily.  Marland Kitchen gabapentin (NEURONTIN) 100 MG capsule Take 100 mg by mouth as needed (pain).   . hydrochlorothiazide (MICROZIDE) 12.5 MG capsule Take 1 capsule (12.5 mg total) by mouth daily.  . Multiple Vitamin (MULTIVITAMIN WITH MINERALS) TABS tablet Take 1 tablet by mouth daily.  Marland Kitchen  nitroGLYCERIN (NITROSTAT) 0.4 MG SL tablet Place 1 tablet (0.4 mg total) under the tongue every 5 (five) minutes as needed for chest pain.  . potassium chloride SA (KLOR-CON M20) 20 MEQ tablet Take 1 tablet (20 mEq total) by mouth daily.  . simvastatin (ZOCOR) 40 MG tablet TAKE 1 TABLET EVERY EVENING  ( APPOINTMENT IS NEEDED  )  . tamsulosin (FLOMAX) 0.4 MG CAPS capsule Take 0.4 mg by mouth 2 (two) times daily.   . [DISCONTINUED] hydrALAZINE (APRESOLINE) 25 MG tablet Take 1 tablet (25 mg total) by mouth 3 (three) times daily.     Allergies:   Patient has no known allergies.   Social History   Socioeconomic History  . Marital status: Married    Spouse name: Not on file  . Number of  children: Not on file  . Years of education: Not on file  . Highest education level: Not on file  Occupational History  . Not on file  Tobacco Use  . Smoking status: Former Smoker    Packs/day: 1.00    Types: Cigarettes    Quit date: 2000    Years since quitting: 21.0  . Smokeless tobacco: Never Used  Substance and Sexual Activity  . Alcohol use: No  . Drug use: No  . Sexual activity: Not on file  Other Topics Concern  . Not on file  Social History Narrative  . Not on file   Social Determinants of Health   Financial Resource Strain:   . Difficulty of Paying Living Expenses: Not on file  Food Insecurity:   . Worried About Charity fundraiser in the Last Year: Not on file  . Ran Out of Food in the Last Year: Not on file  Transportation Needs:   . Lack of Transportation (Medical): Not on file  . Lack of Transportation (Non-Medical): Not on file  Physical Activity:   . Days of Exercise per Week: Not on file  . Minutes of Exercise per Session: Not on file  Stress:   . Feeling of Stress : Not on file  Social Connections:   . Frequency of Communication with Friends and Family: Not on file  . Frequency of Social Gatherings with Friends and Family: Not on file  . Attends Religious Services: Not on file  . Active Member of Clubs or Organizations: Not on file  . Attends Archivist Meetings: Not on file  . Marital Status: Not on file     Family History: The patient's family history includes Colon cancer in his mother; Heart disease in his father.  ROS:   Review of Systems  Constitution: Negative for decreased appetite, fever and weight gain.  HENT: Negative for congestion, ear discharge, hoarse voice and sore throat.   Eyes: Negative for discharge, redness, vision loss in right eye and visual halos.  Cardiovascular: Negative for chest pain, dyspnea on exertion, leg swelling, orthopnea and palpitations.  Respiratory: Negative for cough, hemoptysis, shortness of  breath and snoring.   Endocrine: Negative for heat intolerance and polyphagia.  Hematologic/Lymphatic: Negative for bleeding problem. Does not bruise/bleed easily.  Skin: Negative for flushing, nail changes, rash and suspicious lesions.  Musculoskeletal: Negative for arthritis, joint pain, muscle cramps, myalgias, neck pain and stiffness.  Gastrointestinal: Negative for abdominal pain, bowel incontinence, diarrhea and excessive appetite.  Genitourinary: Negative for decreased libido, genital sores and incomplete emptying.  Neurological: Negative for brief paralysis, focal weakness, headaches and loss of balance.  Psychiatric/Behavioral: Negative for altered mental status, depression  and suicidal ideas.  Allergic/Immunologic: Negative for HIV exposure and persistent infections.    EKGs/Labs/Other Studies Reviewed:    The following studies were reviewed today:   EKG: None today  Recent Labs: 02/26/2019: ALT 13 05/01/2019: BUN 17; Creatinine, Ser 1.06; Magnesium 2.1; Potassium 4.2; Sodium 140  Recent Lipid Panel    Component Value Date/Time   CHOL 137 02/26/2019 1553   TRIG 240 (H) 02/26/2019 1553   HDL 50 02/26/2019 1553   CHOLHDL 2.7 02/26/2019 1553   LDLCALC 49 02/26/2019 1553    Physical Exam:    VS:  BP (!) 170/76   Pulse 66   Ht 6' (1.829 m)   Wt 212 lb (96.2 kg)   SpO2 95%   BMI 28.75 kg/m     Wt Readings from Last 3 Encounters:  05/18/19 212 lb (96.2 kg)  05/04/19 213 lb 12.8 oz (97 kg)  03/26/19 212 lb (96.2 kg)     GEN: Well nourished, well developed in no acute distress HEENT: Normal NECK: No JVD; No carotid bruits LYMPHATICS: No lymphadenopathy CARDIAC: S1S2 noted,RRR, no murmurs, rubs, gallops RESPIRATORY:  Clear to auscultation without rales, wheezing or rhonchi  ABDOMEN: Soft, non-tender, non-distended, +bowel sounds, no guarding. EXTREMITIES: No edema, No cyanosis, no clubbing MUSCULOSKELETAL:  No edema; No deformity  SKIN: Warm and  dry NEUROLOGIC:  Alert and oriented x 3, non-focal PSYCHIATRIC:  Normal affect, good insight  ASSESSMENT:    1. Uncontrolled hypertension    PLAN:    1.  His blood pressure is elevated in the office today.  Patient is compliant with his medical therapy however is not compliant with his diet.  His wife tells me that he is eating significantly increased salty foods especially over the holidays.  Today I am going to increase his hydralazine to 50 mg 3 times a day.  Which will make his blood pressure regimen hydralazine 50 mg 3 times a day, carvedilol 6.25 mg twice daily, Lasix 40 mg daily and hydrochlorothiazide 12.5 mg daily.  If we see no improvement I am can add amlodipine to his blood pressure medication regimen before adding Aldactone.  For now I stressed with the patient that compliant with decreasing salt is just as important as taking his medication daily.  2.  Coronary disease-continue patient on his Plavix and simvastatin 40 mg daily.   The patient is in agreement with the above plan. The patient left the office in stable condition.  The patient will follow up in 1 month or sooner if needed   Medication Adjustments/Labs and Tests Ordered: Current medicines are reviewed at length with the patient today.  Concerns regarding medicines are outlined above.  No orders of the defined types were placed in this encounter.  Meds ordered this encounter  Medications  . hydrALAZINE (APRESOLINE) 50 MG tablet    Sig: Take 1 tablet (50 mg total) by mouth 3 (three) times daily.    Dispense:  270 tablet    Refill:  1    Patient Instructions  Medication Instructions:  Your physician has recommended you make the following change in your medication:   INCREASE: Hydralazine to 50 mg Take 1 tab 3 times daily(may take 2 tabs of 25 mg 3 times daily until you run out)  *If you need a refill on your cardiac medications before your next appointment, please call your pharmacy*  Lab Work: None If  you have labs (blood work) drawn today and your tests are completely normal, you will receive  your results only by: Marland Kitchen MyChart Message (if you have MyChart) OR . A paper copy in the mail If you have any lab test that is abnormal or we need to change your treatment, we will call you to review the results.  Testing/Procedures: nOne  Follow-Up: At [facility], you and your health needs are our priority.  As part of our continuing mission to provide you with exceptional heart care, we have created designated Provider Care Teams.  These Care Teams include your primary Cardiologist (physician) and Advanced Practice Providers (APPs -  Physician Assistants and Nurse Practitioners) who all work together to provide you with the care you need, when you need it.  Your next appointment:   1 month(s)  The format for your next appointment:   In Person  Provider:   Berniece Salines, DO  Other Instructions      Adopting a Healthy Lifestyle.  Know what a healthy weight is for you (roughly BMI <25) and aim to maintain this   Aim for 7+ servings of fruits and vegetables daily   65-80+ fluid ounces of water or unsweet tea for healthy kidneys   Limit to max 1 drink of alcohol per day; avoid smoking/tobacco   Limit animal fats in diet for cholesterol and heart health - choose grass fed whenever available   Avoid highly processed foods, and foods high in saturated/trans fats   Aim for low stress - take time to unwind and care for your mental health   Aim for 150 min of moderate intensity exercise weekly for heart health, and weights twice weekly for bone health   Aim for 7-9 hours of sleep daily   When it comes to diets, agreement about the perfect plan isnt easy to find, even among the experts. Experts at the [employer] developed an idea known as the Healthy Eating Plate. Just imagine a plate divided into logical, healthy portions.   The emphasis is on diet quality:    Load up on vegetables and fruits - one-half of your plate: Aim for color and variety, and remember that potatoes dont count.   Go for whole grains - one-quarter of your plate: Whole wheat, barley, wheat berries, quinoa, oats, brown rice, and foods made with them. If you want pasta, go with whole wheat pasta.   Protein power - one-quarter of your plate: Fish, chicken, beans, and nuts are all healthy, versatile protein sources. Limit red meat.   The diet, however, does go beyond the plate, offering a few other suggestions.   Use healthy plant oils, such as olive, canola, soy, corn, sunflower and peanut. Check the labels, and avoid partially hydrogenated oil, which have unhealthy trans fats.   If youre thirsty, drink water. Coffee and tea are good in moderation, but skip sugary drinks and limit milk and dairy products to one or two daily servings.   The type of carbohydrate in the diet is more important than the amount. Some sources of carbohydrates, such as vegetables, fruits, whole grains, and beans-are healthier than others.   Finally, stay active  Signed, Berniece Salines, DO  05/18/2019 4:04 PM    [facility]

## 2019-05-18 NOTE — Patient Instructions (Signed)
Medication Instructions:  Your physician has recommended you make the following change in your medication:   INCREASE: Hydralazine to 50 mg Take 1 tab 3 times daily(may take 2 tabs of 25 mg 3 times daily until you run out)  *If you need a refill on your cardiac medications before your next appointment, please call your pharmacy*  Lab Work: None If you have labs (blood work) drawn today and your tests are completely normal, you will receive your results only by: Marland Kitchen MyChart Message (if you have MyChart) OR . A paper copy in the mail If you have any lab test that is abnormal or we need to change your treatment, we will call you to review the results.  Testing/Procedures: nOne  Follow-Up: At [facility], you and your health needs are our priority.  As part of our continuing mission to provide you with exceptional heart care, we have created designated Provider Care Teams.  These Care Teams include your primary Cardiologist (physician) and Advanced Practice Providers (APPs -  Physician Assistants and Nurse Practitioners) who all work together to provide you with the care you need, when you need it.  Your next appointment:   1 month(s)  The format for your next appointment:   In Person  Provider:   Berniece Salines, DO  Other Instructions

## 2019-06-19 ENCOUNTER — Ambulatory Visit: Payer: Medicare HMO | Admitting: Cardiology

## 2019-07-01 ENCOUNTER — Ambulatory Visit (INDEPENDENT_AMBULATORY_CARE_PROVIDER_SITE_OTHER): Payer: Medicare HMO | Admitting: Cardiology

## 2019-07-01 ENCOUNTER — Encounter: Payer: Self-pay | Admitting: Cardiology

## 2019-07-01 ENCOUNTER — Other Ambulatory Visit: Payer: Self-pay

## 2019-07-01 VITALS — BP 148/80 | HR 58 | Temp 97.4°F | Ht 72.0 in | Wt 208.0 lb

## 2019-07-01 DIAGNOSIS — I10 Essential (primary) hypertension: Secondary | ICD-10-CM | POA: Diagnosis not present

## 2019-07-01 DIAGNOSIS — E782 Mixed hyperlipidemia: Secondary | ICD-10-CM | POA: Insufficient documentation

## 2019-07-01 DIAGNOSIS — I2510 Atherosclerotic heart disease of native coronary artery without angina pectoris: Secondary | ICD-10-CM | POA: Diagnosis not present

## 2019-07-01 HISTORY — DX: Mixed hyperlipidemia: E78.2

## 2019-07-01 MED ORDER — FUROSEMIDE 40 MG PO TABS: ORAL_TABLET | ORAL | 1 refills | Status: DC

## 2019-07-01 NOTE — Progress Notes (Signed)
Cardiology Office Note:    Date:  07/01/2019   ID:  [name], DOB Feb 18, 1941, MRN [id number]  PCP:  Algis Greenhouse, MD  Cardiologist:  Berniece Salines, DO  Electrophysiologist:  None   Referring MD: Algis Greenhouse, MD   The patient here for follow-up hypertension.  History of Present Illness:    Jay B Streetis a 79 y.o.malewith a hx of hx of coronary artery disease with PCI 2014 drug-eluting stent to the LAD diagonal and right coronary artery as well as hypertension and dyslipidemia.  The patient was seen on May 02, 2019, at which time he was hypertensive in the office I did increase his carvedilol to 6.25 mg twice daily.  His Lasix was also changed to 40 mg daily he was remain on his hydralazine 25 mg 3 times a day and hydrochlorothiazide 12.5 mg.  He was asked at that time to bring his medication with him as to be able to review it together.   I saw him on December 28,2020 at that time I increase his hydralazine to 50 mg three times a day. He was continued on lasix 40mg  daily, HCTZ 12.5 mg daily and coreg 6.25 mg BID. At that time I also encouraged the patient to decrease his salt intake.   Today he is here for a follow visit today. He offers no complaints.   Past Medical History:  Diagnosis Date  . Arthritis   . Benign essential hypertension 04/05/2016  . CAD (coronary artery disease), native coronary artery 04/05/2016   Overview:  Formatting of this note may be different from the original.      11/2012: DES of LAD, D2 and RCA   . Cancer ([place])   . Dysrhythmia    [facility]  . 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 Erectile dysfunction 04/05/2016  . 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 Hypertension   . 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 Jaundice    AGE 44  . Osteoarthritis of hip 07/21/2015  . 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 PONV (postoperative nausea and vomiting)   . Post herpetic neuralgia  02/04/2017   Overview:  2018  . Preoperative cardiovascular examination 05/05/2015  . 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 CARDIAC CATHETERIZATION    . CHOLECYSTECTOMY     2002 [place]  . COLON SURGERY     04/2012 [place]  . CORONARY ANGIOPLASTY     [facility]  2014  . HIP ARTHROPLASTY     RIGHT HIP  2012  . TOTAL HIP ARTHROPLASTY Left 07/21/2015   Procedure: LEFT TOTAL HIP ARTHROPLASTY ANTERIOR APPROACH;  Surgeon: Mcarthur Rossetti, MD;  Location: [facility];  Service: Orthopedics;  Laterality: Left;    Current Medications: Current Meds  Medication Sig  . Ascorbic Acid (VITAMIN C) 1000 MG tablet Take 1,000 mg by mouth daily.  . carvedilol (COREG) 6.25 MG tablet Take 1 tablet (6.25 mg total) by mouth 2 (two) times daily.  . clopidogrel (PLAVIX) 75 MG tablet TAKE 1 TABLET EVERY DAY  . dutasteride (AVODART) 0.5 MG capsule Take 1 capsule by mouth daily.  . fluticasone (FLONASE) 50 MCG/ACT nasal spray Place 1 spray into both nostrils daily.  . furosemide (LASIX) 40 MG tablet Take 1 tab on Tues and Thurs  . gabapentin (NEURONTIN) 100  MG capsule Take 100 mg by mouth as needed (pain).   . hydrALAZINE (APRESOLINE) 50 MG tablet Take 1 tablet (50 mg total) by mouth 3 (three) times daily.  . hydrochlorothiazide (MICROZIDE) 12.5 MG capsule Take 1 capsule (12.5 mg total) by mouth daily.  . Multiple Vitamin (MULTIVITAMIN WITH MINERALS) TABS tablet Take 1 tablet by mouth daily.  . nitroGLYCERIN (NITROSTAT) 0.4 MG SL tablet Place 1 tablet (0.4 mg total) under the tongue every 5 (five) minutes as needed for chest pain.  . potassium chloride SA (KLOR-CON M20) 20 MEQ tablet Take 1 tablet (20 mEq total) by mouth daily.  . simvastatin (ZOCOR) 40 MG tablet TAKE 1 TABLET EVERY EVENING  ( APPOINTMENT IS NEEDED  )  . tamsulosin (FLOMAX) 0.4 MG CAPS capsule Take 0.4  mg by mouth 2 (two) times daily.   . [DISCONTINUED] furosemide (LASIX) 40 MG tablet Take 1 tablet (40 mg total) by mouth daily.     Allergies:   Patient has no known allergies.   Social History   Socioeconomic History  . Marital status: Married    Spouse name: Not on file  . Number of children: Not on file  . Years of education: Not on file  . Highest education level: Not on file  Occupational History  . Not on file  Tobacco Use  . Smoking status: Former Smoker    Packs/day: 1.00    Types: Cigarettes    Quit date: 2000    Years since quitting: 21.1  . Smokeless tobacco: Never Used  Substance and Sexual Activity  . Alcohol use: No  . Drug use: No  . Sexual activity: Not on file  Other Topics Concern  . Not on file  Social History Narrative  . Not on file   Social Determinants of Health   Financial Resource Strain:   . Difficulty of Paying Living Expenses: Not on file  Food Insecurity:   . Worried About Charity fundraiser in the Last Year: Not on file  . Ran Out of Food in the Last Year: Not on file  Transportation Needs:   . Lack of Transportation (Medical): Not on file  . Lack of Transportation (Non-Medical): Not on file  Physical Activity:   . Days of Exercise per Week: Not on file  . Minutes of Exercise per Session: Not on file  Stress:   . Feeling of Stress : Not on file  Social Connections:   . Frequency of Communication with Friends and Family: Not on file  . Frequency of Social Gatherings with Friends and Family: Not on file  . Attends Religious Services: Not on file  . Active Member of Clubs or Organizations: Not on file  . Attends Archivist Meetings: Not on file  . Marital Status: Not on file     Family History: The patient's family history includes Colon cancer in his mother; Heart disease in his father.  ROS:   Review of Systems  Constitution: Negative for decreased appetite, fever and weight gain.  HENT: Negative for congestion, ear  discharge, hoarse voice and sore throat.   Eyes: Negative for discharge, redness, vision loss in right eye and visual halos.  Cardiovascular: Negative for chest pain, dyspnea on exertion, leg swelling, orthopnea and palpitations.  Respiratory: Negative for cough, hemoptysis, shortness of breath and snoring.   Endocrine: Negative for heat intolerance and polyphagia.  Hematologic/Lymphatic: Negative for bleeding problem. Does not bruise/bleed easily.  Skin: Negative for flushing, nail changes, rash and  suspicious lesions.  Musculoskeletal: Negative for arthritis, joint pain, muscle cramps, myalgias, neck pain and stiffness.  Gastrointestinal: Negative for abdominal pain, bowel incontinence, diarrhea and excessive appetite.  Genitourinary: Negative for decreased libido, genital sores and incomplete emptying.  Neurological: Negative for brief paralysis, focal weakness, headaches and loss of balance.  Psychiatric/Behavioral: Negative for altered mental status, depression and suicidal ideas.  Allergic/Immunologic: Negative for HIV exposure and persistent infections.    EKGs/Labs/Other Studies Reviewed:    The following studies were reviewed today:   EKG: None today  Recent Labs: 02/26/2019: ALT 13 05/01/2019: BUN 17; Creatinine, Ser 1.06; Magnesium 2.1; Potassium 4.2; Sodium 140  Recent Lipid Panel    Component Value Date/Time   CHOL 137 02/26/2019 1553   TRIG 240 (H) 02/26/2019 1553   HDL 50 02/26/2019 1553   CHOLHDL 2.7 02/26/2019 1553   LDLCALC 49 02/26/2019 1553    Physical Exam:    VS:  BP (!) 148/80   Pulse (!) 58   Temp (!) 97.4 F (36.3 C)   Ht 6' (1.829 m)   Wt 208 lb (94.3 kg)   SpO2 98%   BMI 28.21 kg/m     Wt Readings from Last 3 Encounters:  07/01/19 208 lb (94.3 kg)  05/18/19 212 lb (96.2 kg)  05/04/19 213 lb 12.8 oz (97 kg)     GEN: Well nourished, well developed in no acute distress HEENT: Normal NECK: No JVD; No carotid bruits LYMPHATICS: No  lymphadenopathy CARDIAC: S1S2 noted,RRR, no murmurs, rubs, gallops RESPIRATORY:  Clear to auscultation without rales, wheezing or rhonchi  ABDOMEN: Soft, non-tender, non-distended, +bowel sounds, no guarding. EXTREMITIES: No edema, No cyanosis, no clubbing MUSCULOSKELETAL:  No deformity  SKIN: Warm and dry NEUROLOGIC:  Alert and oriented x 3, non-focal PSYCHIATRIC:  Normal affect, good insight  ASSESSMENT:    1. Coronary artery disease involving native coronary artery of native heart without angina pectoris   2. Benign essential hypertension   3. Mixed hyperlipidemia    PLAN:    I was able to review the patient home blood pressure readings.  He is averaging in the 130s over 80s at home.  He prefers to cut back on his Lasix which I do not think this is unreasonable therefore I am going to have the patient take Lasix Tuesday and Thursdays 40 mg daily.  Did advise him that he should continue take his blood pressure as this may be slightly elevated since he is off the Lasix but we will be able to adjust his home antihypertensives to keep his target at less than 130/80 mmHg.  Blood work will be done today to include BMP and mag.   The patient is in agreement with the above plan. The patient left the office in stable condition.  The patient will follow up in 3 months.  Total visit time 30 minutes  Medication Adjustments/Labs and Tests Ordered: Current medicines are reviewed at length with the patient today.  Concerns regarding medicines are outlined above.  Orders Placed This Encounter  Procedures  . Basic Metabolic Panel (BMET)  . Magnesium   Meds ordered this encounter  Medications  . furosemide (LASIX) 40 MG tablet    Sig: Take 1 tab on Tues and Thurs    Dispense:  90 tablet    Refill:  1    Patient Instructions  Medication Instructions:  Your physician has recommended you make the following change in your medication:   DECREASE: Furosemide to Tues and Thurs  *If  you need  a refill on your cardiac medications before your next appointment, please call your pharmacy*  Lab Work: Your physician recommends that you return for lab work in: TODAY BMP,MAgnesium  If you have labs (blood work) drawn today and your tests are completely normal, you will receive your results only by: Marland Kitchen MyChart Message (if you have MyChart) OR . A paper copy in the mail If you have any lab test that is abnormal or we need to change your treatment, we will call you to review the results.  Testing/Procedures: NOne  Follow-Up: At [facility], you and your health needs are our priority.  As part of our continuing mission to provide you with exceptional heart care, we have created designated Provider Care Teams.  These Care Teams include your primary Cardiologist (physician) and Advanced Practice Providers (APPs -  Physician Assistants and Nurse Practitioners) who all work together to provide you with the care you need, when you need it.  Your next appointment:   3 month(s)  The format for your next appointment:   In Person  Provider:   Berniece Salines, DO  Other Instructions      Adopting a Healthy Lifestyle.  Know what a healthy weight is for you (roughly BMI <25) and aim to maintain this   Aim for 7+ servings of fruits and vegetables daily   65-80+ fluid ounces of water or unsweet tea for healthy kidneys   Limit to max 1 drink of alcohol per day; avoid smoking/tobacco   Limit animal fats in diet for cholesterol and heart health - choose grass fed whenever available   Avoid highly processed foods, and foods high in saturated/trans fats   Aim for low stress - take time to unwind and care for your mental health   Aim for 150 min of moderate intensity exercise weekly for heart health, and weights twice weekly for bone health   Aim for 7-9 hours of sleep daily   When it comes to diets, agreement about the perfect plan isnt easy to find, even among the experts. Experts at  the [employer] developed an idea known as the Healthy Eating Plate. Just imagine a plate divided into logical, healthy portions.   The emphasis is on diet quality:   Load up on vegetables and fruits - one-half of your plate: Aim for color and variety, and remember that potatoes dont count.   Go for whole grains - one-quarter of your plate: Whole wheat, barley, wheat berries, quinoa, oats, brown rice, and foods made with them. If you want pasta, go with whole wheat pasta.   Protein power - one-quarter of your plate: Fish, chicken, beans, and nuts are all healthy, versatile protein sources. Limit red meat.   The diet, however, does go beyond the plate, offering a few other suggestions.   Use healthy plant oils, such as olive, canola, soy, corn, sunflower and peanut. Check the labels, and avoid partially hydrogenated oil, which have unhealthy trans fats.   If youre thirsty, drink water. Coffee and tea are good in moderation, but skip sugary drinks and limit milk and dairy products to one or two daily servings.   The type of carbohydrate in the diet is more important than the amount. Some sources of carbohydrates, such as vegetables, fruits, whole grains, and beans-are healthier than others.   Finally, stay active  Signed, Berniece Salines, DO  07/01/2019 9:32 PM    [facility]

## 2019-07-01 NOTE — Patient Instructions (Signed)
Medication Instructions:  Your physician has recommended you make the following change in your medication:   DECREASE: Furosemide to Tues and Thurs  *If you need a refill on your cardiac medications before your next appointment, please call your pharmacy*  Lab Work: Your physician recommends that you return for lab work in: TODAY BMP,MAgnesium  If you have labs (blood work) drawn today and your tests are completely normal, you will receive your results only by: Marland Kitchen MyChart Message (if you have MyChart) OR . A paper copy in the mail If you have any lab test that is abnormal or we need to change your treatment, we will call you to review the results.  Testing/Procedures: NOne  Follow-Up: At [facility], you and your health needs are our priority.  As part of our continuing mission to provide you with exceptional heart care, we have created designated Provider Care Teams.  These Care Teams include your primary Cardiologist (physician) and Advanced Practice Providers (APPs -  Physician Assistants and Nurse Practitioners) who all work together to provide you with the care you need, when you need it.  Your next appointment:   3 month(s)  The format for your next appointment:   In Person  Provider:   Berniece Salines, DO  Other Instructions

## 2019-07-02 LAB — BASIC METABOLIC PANEL
BUN/Creatinine Ratio: 16 (ref 10–24)
BUN: 15 mg/dL (ref 8–27)
CO2: 29 mmol/L (ref 20–29)
Calcium: 9 mg/dL (ref 8.6–10.2)
Chloride: 97 mmol/L (ref 96–106)
Creatinine, Ser: 0.96 mg/dL (ref 0.76–1.27)
GFR calc Af Amer: 88 mL/min/{1.73_m2} (ref >59)
GFR calc non Af Amer: 76 mL/min/{1.73_m2} (ref >59)
Glucose: 95 mg/dL (ref 65–99)
Potassium: 4.1 mmol/L (ref 3.5–5.2)
Sodium: 141 mmol/L (ref 134–144)

## 2019-07-02 LAB — MAGNESIUM: Magnesium: 2.2 mg/dL (ref 1.6–2.3)

## 2019-07-16 ENCOUNTER — Telehealth: Payer: Self-pay | Admitting: *Deleted

## 2019-07-16 MED ORDER — CLOPIDOGREL BISULFATE 75 MG PO TABS: 75.0000 mg | ORAL_TABLET | Freq: Every day | ORAL | 1 refills | Status: DC

## 2019-07-16 NOTE — Telephone Encounter (Signed)
Rx refill sent to pharmacy. 

## 2019-07-20 ENCOUNTER — Other Ambulatory Visit: Payer: Self-pay | Admitting: Cardiology

## 2019-07-20 MED ORDER — CLOPIDOGREL BISULFATE 75 MG PO TABS: 75.0000 mg | ORAL_TABLET | Freq: Every day | ORAL | 3 refills | Status: DC

## 2019-07-20 NOTE — Telephone Encounter (Signed)
°*  STAT* If patient is at the pharmacy, call can be transferred to refill team.   1. Which medications need to be refilled? (please list name of each medication and dose if known) clopidogrel (PLAVIX) 75 MG tablet  2. Which pharmacy/location (including [place] and city if local pharmacy) is medication to be sent to? [place], [place]  3. Do they need a 30 day or 90 day supply? [place]

## 2019-07-20 NOTE — Telephone Encounter (Signed)
Patient's wife Mel Almond is returning phone call.

## 2019-07-20 NOTE — Telephone Encounter (Signed)
Returned call to wife (ok per DPR)-new rx sent.  Previous rx was "no print".   She is aware and verbalized understanding.

## 2019-07-20 NOTE — Telephone Encounter (Signed)
Left message for patient to return call. Looks like med was just refilled already with 90 day supply.

## 2019-07-28 ENCOUNTER — Other Ambulatory Visit: Payer: Self-pay | Admitting: Cardiology

## 2019-07-29 ENCOUNTER — Other Ambulatory Visit: Payer: Self-pay | Admitting: Cardiology

## 2019-08-05 ENCOUNTER — Telehealth: Payer: Self-pay | Admitting: *Deleted

## 2019-08-05 NOTE — Telephone Encounter (Signed)
Patient's wife called in to ask about a bill they received for patient's recent OV. Billing department phone number provided.

## 2019-09-04 ENCOUNTER — Other Ambulatory Visit: Payer: Self-pay | Admitting: Family

## 2019-09-06 ENCOUNTER — Other Ambulatory Visit: Payer: Self-pay | Admitting: Cardiology

## 2019-10-12 ENCOUNTER — Other Ambulatory Visit: Payer: Self-pay | Admitting: Cardiology

## 2019-10-21 ENCOUNTER — Telehealth: Payer: Self-pay | Admitting: Cardiology

## 2019-10-21 NOTE — Telephone Encounter (Signed)
Mel Almond the patient's wife is calling requesting to attend Ashan's appointment on Monday 10/26/19 due to him not remembering well. Please advise.

## 2019-10-21 NOTE — Telephone Encounter (Signed)
That will be fine. 

## 2019-10-21 NOTE — Telephone Encounter (Signed)
Spoke with the patients wife and let her know that Dr. Harriet Masson said it would be fine for her to come with [name] to his appointment. No other issues or concerns were noted at this time.    Encouraged patient to call back with any questions or concerns.

## 2019-10-23 ENCOUNTER — Other Ambulatory Visit: Payer: Self-pay

## 2019-10-26 ENCOUNTER — Encounter: Payer: Self-pay | Admitting: Cardiology

## 2019-10-26 ENCOUNTER — Ambulatory Visit (INDEPENDENT_AMBULATORY_CARE_PROVIDER_SITE_OTHER): Payer: Medicare HMO | Admitting: Cardiology

## 2019-10-26 ENCOUNTER — Other Ambulatory Visit: Payer: Self-pay

## 2019-10-26 VITALS — BP 140/70 | HR 51 | Ht 72.0 in | Wt 209.0 lb

## 2019-10-26 DIAGNOSIS — I2510 Atherosclerotic heart disease of native coronary artery without angina pectoris: Secondary | ICD-10-CM

## 2019-10-26 DIAGNOSIS — R001 Bradycardia, unspecified: Secondary | ICD-10-CM

## 2019-10-26 DIAGNOSIS — R5383 Other fatigue: Secondary | ICD-10-CM

## 2019-10-26 DIAGNOSIS — R400 Somnolence: Secondary | ICD-10-CM | POA: Diagnosis not present

## 2019-10-26 DIAGNOSIS — I10 Essential (primary) hypertension: Secondary | ICD-10-CM | POA: Diagnosis not present

## 2019-10-26 DIAGNOSIS — E782 Mixed hyperlipidemia: Secondary | ICD-10-CM

## 2019-10-26 NOTE — Patient Instructions (Signed)
Medication Instructions:  Your physician recommends that you continue on your current medications as directed. Please refer to the Current Medication list given to you today.  *If you need a refill on your cardiac medications before your next appointment, please call your pharmacy*   Lab Work: Your physician recommends that you return for lab work today: bmp, mg, cbc, tsh, vitamin d  If you have labs (blood work) drawn today and your tests are completely normal, you will receive your results only by:  MyChart Message (if you have MyChart) OR  A paper copy in the mail If you have any lab test that is abnormal or we need to change your treatment, we will call you to review the results.   Testing/Procedures: Your physician has recommended that you have a sleep study. This test records several body functions during sleep, including: brain activity, eye movement, oxygen and carbon dioxide blood levels, heart rate and rhythm, breathing rate and rhythm, the flow of air through your mouth and nose, snoring, body muscle movements, and chest and belly movement.     Follow-Up: At [facility], you and your health needs are our priority.  As part of our continuing mission to provide you with exceptional heart care, we have created designated Provider Care Teams.  These Care Teams include your primary Cardiologist (physician) and Advanced Practice Providers (APPs -  Physician Assistants and Nurse Practitioners) who all work together to provide you with the care you need, when you need it.  We recommend signing up for the patient portal called "MyChart".  Sign up information is provided on this After Visit Summary.  MyChart is used to connect with patients for Virtual Visits (Telemedicine).  Patients are able to view lab/test results, encounter notes, upcoming appointments, etc.  Non-urgent messages can be sent to your provider as well.   To learn more about what you can do with MyChart, go to  [email or web address].    Your next appointment:   3 month(s)  The format for your next appointment:   In Person  Provider:   Berniece Salines, DO   Other Instructions   Sleep Studies A sleep study (polysomnogram) is a series of tests done while you are sleeping. A sleep study records your brain waves, heart rate, breathing rate, oxygen level, and eye and leg movements. A sleep study helps your health care provider:  See how well you sleep.  Diagnose a sleep disorder.  Determine how severe your sleep disorder is.  Create a plan to treat your sleep disorder. Your health care provider may recommend a sleep study if you:  Feel sleepy on most days.  Snore loudly while sleeping.  Have unusual behaviors while you sleep, such as walking.  Have brief periods in which you stop breathing during sleep (sleepapnea).  Fall asleep suddenly during the day (narcolepsy).  Have trouble falling asleep or staying asleep (insomnia).  Feel like you need to move your legs when trying to fall asleep (restless legs syndrome).  Move your legs by flexing and extending them regularly while asleep (periodic limb movement disorder).  Act out your dreams while you sleep (sleep behavior disorder).  Feel like you cannot move when you first wake up (sleep paralysis). What tests are part of a sleep study? Most sleep studies record the following during sleep:  Brain activity.  Eye movements.  Heart rate and rhythm.  Breathing rate and rhythm.  Blood-oxygen level.  Blood pressure.  Chest and belly movement as you breathe.  Arm and leg movements.  Snoring or other noises.  Body position. Where are sleep studies done? Sleep studies are done at sleep centers. A sleep center may be inside a hospital, office, or clinic. The room where you have the study may look like a hospital room or a hotel room. The health care providers doing the study may come in and out of the room during the study.  Most of the time, they will be in another room monitoring your test as you sleep. How are sleep studies done? Most sleep studies are done during a normal period of time for a full night of sleep. You will arrive at the study center in the evening and go home in the morning. Before the test  Bring your pajamas and toothbrush with you to the sleep study.  Do not have caffeine on the day of your sleep study.  Do not drink alcohol on the day of your sleep study.  Your health care provider will let you know if you should stop taking any of your regular medicines before the test. During the test      Round, sticky patches with sensors attached to recording wires (electrodes) are placed on your scalp, face, chest, and limbs.  Wires from all the electrodes and sensors run from your bed to a computer. The wires can be taken off and put back on if you need to get out of bed to go to the bathroom.  A sensor is placed over your nose to measure airflow.  A finger clip is put on your finger or ear to measure your blood oxygen level (pulse oximetry).  A belt is placed around your belly and a belt is placed around your chest to measure breathing movements.  If you have signs of the sleep disorder called sleep apnea during your test, you may get a treatment mask to wear for the second half of the night. ? The mask provides positive airway pressure (PAP) to help you breathe better during sleep. This may greatly improve your sleep apnea. ? You will then have all tests done again with the mask in place to see if your measurements and recordings change. After the test  A medical doctor who specializes in sleep will evaluate the results of your sleep study and share them with you and your primary health care provider.  Based on your results, your medical history, and a physical exam, you may be diagnosed with a sleep disorder, such as: ? Sleep apnea. ? Restless legs syndrome. ? Sleep-related behavior  disorder. ? Sleep-related movement disorders. ? Sleep-related seizure disorders.  Your health care team will help determine your treatment options based on your diagnosis. This may include: ? Improving your sleep habits (sleep hygiene). ? Wearing a continuous positive airway pressure (CPAP) or bi-level positive airway pressure (BPAP) mask. ? Wearing an oral device at night to improve breathing and reduce snoring. ? Taking medicines. Follow these instructions at home:  Take over-the-counter and prescription medicines only as told by your health care provider.  If you are instructed to use a CPAP or BPAP mask, make sure you use it nightly as directed.  Make any lifestyle changes that your health care provider recommends.  If you were given a device to open your airway while you sleep, use it only as told by your health care provider.  Do not use any tobacco products, such as cigarettes, chewing tobacco, and e-cigarettes. If you need help quitting, ask your health care  provider.  Keep all follow-up visits as told by your health care provider. This is important. Summary  A sleep study (polysomnogram) is a series of tests done while you are sleeping. It shows how well you sleep.  Most sleep studies are done over one full night of sleep. You will arrive at the study center in the evening and go home in the morning.  If you have signs of the sleep disorder called sleep apnea during your test, you may get a treatment mask to wear for the second half of the night.  A medical doctor who specializes in sleep will evaluate the results of your sleep study and share them with your primary health care provider. This information is not intended to replace advice given to you by your health care provider. Make sure you discuss any questions you have with your health care provider. Document Revised: 10/22/2018 Document Reviewed: 06/04/2017 Elsevier Patient Education  [employer].

## 2019-10-26 NOTE — Progress Notes (Signed)
Cardiology Office Note:    Date:  10/26/2019   ID:  FRANK NOVELO, DOB 06-Dec-1940, MRN [id number]  PCP:  Algis Greenhouse, MD  Cardiologist:  Berniece Salines, DO  Electrophysiologist:  None   Referring MD: Algis Greenhouse, MD   "I am very tired and sleepy"  History of Present Illness:    [name] is a 79 y.o. male with a hx of of coronary artery disease with PCI 2014 drug-eluting stent to the LAD diagonal and right coronary artery as well as hypertension and dyslipidemia.  He is happy with his blood pressure at home he tells me that his systolic blood pressures averaging 130s.  But the patient tells me today that he has been expressing significant fatigue.  Tells me that he goes to bed and wakes up feeling tired and fatigued.  He also notes that he has been taking frequent naps during the day which is not him.  Denies any chest pain, shortness of breath, generalized weakness, nausea or vomiting.   Past Medical History:  Diagnosis Date  . Adenomatous polyp of sigmoid colon 03/18/2019   Formatting of this note might be different from the original. 2020: tubular adenoma  . Allergic rhinitis 08/05/2018  . Arthritis   . Benign essential hypertension 04/05/2016  . Bradycardia by electrocardiogram 11/29/2017  . CAD (coronary artery disease), native coronary artery 04/05/2016   Overview:  Formatting of this note may be different from the original.      11/2012: DES of LAD, D2 and RCA   . Cancer ([place])   . Dysrhythmia    [facility]  . 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 Erectile dysfunction 04/05/2016  . 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 Hypertension   . 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 Jaundice    AGE 38  . Mixed hyperlipidemia 07/01/2019  . Osteoarthritis of hip 07/21/2015  . 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 PONV (postoperative nausea and vomiting)   . 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   Overview:  2018  .  Postoperative examination 04/24/2018  . Preoperative cardiovascular examination 05/05/2015  . 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  . Respiratory infection 05/09/2018  . 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 Suspected COVID-19 virus infection 04/23/2019    Past Surgical History:  Procedure Laterality Date  . CARDIAC CATHETERIZATION    . CHOLECYSTECTOMY     2002 [place]  . COLON SURGERY     04/2012 [place]  . CORONARY ANGIOPLASTY     [facility]  2014  . HIP ARTHROPLASTY     RIGHT HIP  2012  . TOTAL HIP ARTHROPLASTY Left 07/21/2015   Procedure: LEFT TOTAL HIP ARTHROPLASTY ANTERIOR APPROACH;  Surgeon: Mcarthur Rossetti, MD;  Location: [facility];  Service: Orthopedics;  Laterality: Left;    Current Medications: Current Meds  Medication Sig  . Ascorbic Acid (VITAMIN C) 1000 MG tablet Take 1,000 mg by mouth daily.  . carvedilol (COREG) 6.25 MG tablet Take 1 tablet (6.25 mg total) by mouth 2 (two) times daily.  . clopidogrel (PLAVIX) 75 MG tablet Take 1 tablet (75 mg total) by mouth daily.  Marland Kitchen dutasteride (AVODART) 0.5 MG capsule Take 1 capsule by mouth daily.  . fluticasone (FLONASE) 50 MCG/ACT nasal spray Place 1 spray into both nostrils daily.  . furosemide (LASIX) 40 MG tablet TAKE 1 TABLET (40 MG  TOTAL) BY MOUTH DAILY.  Marland Kitchen gabapentin (NEURONTIN) 100 MG capsule Take 100 mg by mouth as needed (pain).   . hydrALAZINE (APRESOLINE) 50 MG tablet TAKE 1 TABLET THREE TIMES DAILY  . hydrochlorothiazide (MICROZIDE) 12.5 MG capsule TAKE 1 CAPSULE(12.5 MG) BY MOUTH DAILY  . Multiple Vitamin (MULTIVITAMIN WITH MINERALS) TABS tablet Take 1 tablet by mouth daily.  . nitroGLYCERIN (NITROSTAT) 0.4 MG SL tablet Place 1 tablet (0.4 mg total) under the tongue every 5 (five) minutes as needed for chest pain.  . potassium chloride SA (KLOR-CON) 20 MEQ tablet TAKE 1 TABLET (20 MEQ TOTAL) BY MOUTH DAILY.  . simvastatin  (ZOCOR) 40 MG tablet TAKE 1 TABLET EVERY EVENING  ( APPOINTMENT IS NEEDED  )  . tamsulosin (FLOMAX) 0.4 MG CAPS capsule Take 0.4 mg by mouth 2 (two) times daily.   . Vitamins/Minerals TABS Take 1 tablet by mouth daily.     Allergies:   Patient has no known allergies.   Social History   Socioeconomic History  . Marital status: Married    Spouse name: Not on file  . Number of children: Not on file  . Years of education: Not on file  . Highest education level: Not on file  Occupational History  . Not on file  Tobacco Use  . Smoking status: Former Smoker    Packs/day: 1.00    Types: Cigarettes    Quit date: 2000    Years since quitting: 21.4  . Smokeless tobacco: Never Used  Substance and Sexual Activity  . Alcohol use: No  . Drug use: No  . Sexual activity: Not on file  Other Topics Concern  . Not on file  Social History Narrative  . Not on file   Social Determinants of Health   Financial Resource Strain:   . Difficulty of Paying Living Expenses:   Food Insecurity:   . Worried About Charity fundraiser in the Last Year:   . Arboriculturist in the Last Year:   Transportation Needs:   . Film/video editor (Medical):   Marland Kitchen Lack of Transportation (Non-Medical):   Physical Activity:   . Days of Exercise per Week:   . Minutes of Exercise per Session:   Stress:   . Feeling of Stress :   Social Connections:   . Frequency of Communication with Friends and Family:   . Frequency of Social Gatherings with Friends and Family:   . Attends Religious Services:   . Active Member of Clubs or Organizations:   . Attends Archivist Meetings:   Marland Kitchen Marital Status:      Family History: The patient's family history includes Colon cancer in his mother; Heart disease in his father.  ROS:   Review of Systems  Constitution: Reports fatigue.  Negative for decreased appetite, fever and weight gain.  HENT: Negative for congestion, ear discharge, hoarse voice and sore throat.    Eyes: Negative for discharge, redness, vision loss in right eye and visual halos.  Cardiovascular: Negative for chest pain, dyspnea on exertion, leg swelling, orthopnea and palpitations.  Respiratory: Negative for cough, hemoptysis, shortness of breath and snoring.   Endocrine: Negative for heat intolerance and polyphagia.  Hematologic/Lymphatic: Negative for bleeding problem. Does not bruise/bleed easily.  Skin: Negative for flushing, nail changes, rash and suspicious lesions.  Musculoskeletal: Negative for arthritis, joint pain, muscle cramps, myalgias, neck pain and stiffness.  Gastrointestinal: Negative for abdominal pain, bowel incontinence, diarrhea and excessive appetite.  Genitourinary: Negative for  decreased libido, genital sores and incomplete emptying.  Neurological: Negative for brief paralysis, focal weakness, headaches and loss of balance.  Psychiatric/Behavioral: Negative for altered mental status, depression and suicidal ideas.  Allergic/Immunologic: Negative for HIV exposure and persistent infections.    EKGs/Labs/Other Studies Reviewed:    The following studies were reviewed today:   EKG:  The ekg ordered today demonstrates sinus bradycardia heart rate 51 bpm.   Recent Labs: 02/26/2019: ALT 13 07/01/2019: BUN 15; Creatinine, Ser 0.96; Magnesium 2.2; Potassium 4.1; Sodium 141  Recent Lipid Panel    Component Value Date/Time   CHOL 137 02/26/2019 1553   TRIG 240 (H) 02/26/2019 1553   HDL 50 02/26/2019 1553   CHOLHDL 2.7 02/26/2019 1553   LDLCALC 49 02/26/2019 1553    Physical Exam:    VS:  BP 140/70   Pulse (!) 51   Ht 6' (1.829 m)   Wt 209 lb (94.8 kg)   SpO2 95%   BMI 28.35 kg/m     Wt Readings from Last 3 Encounters:  10/26/19 209 lb (94.8 kg)  07/01/19 208 lb (94.3 kg)  05/18/19 212 lb (96.2 kg)     GEN: Well nourished, well developed in no acute distress HEENT: Normal NECK: No JVD; No carotid bruits LYMPHATICS: No lymphadenopathy CARDIAC:  S1S2 noted,RRR, no murmurs, rubs, gallops RESPIRATORY:  Clear to auscultation without rales, wheezing or rhonchi  ABDOMEN: Soft, non-tender, non-distended, +bowel sounds, no guarding. EXTREMITIES: No edema, No cyanosis, no clubbing MUSCULOSKELETAL:  No deformity  SKIN: Warm and dry NEUROLOGIC:  Alert and oriented x 3, non-focal PSYCHIATRIC:  Normal affect, good insight  ASSESSMENT:    1. Fatigue, unspecified type   2. Daytime somnolence   3. Coronary artery disease involving native coronary artery of native heart without angina pectoris   4. Benign essential hypertension   5. Mixed hyperlipidemia    PLAN:     His fatigue is concerning, will get a transthoracic echocardiogram to assess RV/LV function to make sure that this is not contributing.  With his daytime somnolence a sleep study will be of great benefit.  However the patient has declined.  We will get blood work today assess electrolytes kidney function as well as TSH and vitamin D levels.  His blood pressure is acceptable no changes will be made to his antihypertensive regimen.  Hyperlipidemia-continue patient his current dose of simvastatin 40 mg daily.  Bradycardia, heart rate 51 bpm-he denies any lightheadedness or dizziness.  The patient is in agreement with the above plan. The patient left the office in stable condition.  The patient will follow up in 3 months or sooner if needed.   Medication Adjustments/Labs and Tests Ordered: Current medicines are reviewed at length with the patient today.  Concerns regarding medicines are outlined above.  Orders Placed This Encounter  Procedures  . Basic metabolic panel  . Magnesium  . CBC  . TSH  . Vitamin D 1,25 dihydroxy  . EKG 12-Lead  . ECHOCARDIOGRAM COMPLETE   No orders of the defined types were placed in this encounter.   Patient Instructions  Medication Instructions:  Your physician recommends that you continue on your current medications as directed.  Please refer to the Current Medication list given to you today.  *If you need a refill on your cardiac medications before your next appointment, please call your pharmacy*   Lab Work: Your physician recommends that you return for lab work today: bmp, mg, cbc, tsh, vitamin d  If you have  labs (blood work) drawn today and your tests are completely normal, you will receive your results only by: Marland Kitchen MyChart Message (if you have MyChart) OR . A paper copy in the mail If you have any lab test that is abnormal or we need to change your treatment, we will call you to review the results.   Testing/Procedures: Your physician has recommended that you have a sleep study. This test records several body functions during sleep, including: brain activity, eye movement, oxygen and carbon dioxide blood levels, heart rate and rhythm, breathing rate and rhythm, the flow of air through your mouth and nose, snoring, body muscle movements, and chest and belly movement.     Follow-Up: At [facility], you and your health needs are our priority.  As part of our continuing mission to provide you with exceptional heart care, we have created designated Provider Care Teams.  These Care Teams include your primary Cardiologist (physician) and Advanced Practice Providers (APPs -  Physician Assistants and Nurse Practitioners) who all work together to provide you with the care you need, when you need it.  We recommend signing up for the patient portal called "MyChart".  Sign up information is provided on this After Visit Summary.  MyChart is used to connect with patients for Virtual Visits (Telemedicine).  Patients are able to view lab/test results, encounter notes, upcoming appointments, etc.  Non-urgent messages can be sent to your provider as well.   To learn more about what you can do with MyChart, go to [email or web address].    Your next appointment:   3 month(s)  The format for your next appointment:   In  Person  Provider:   Berniece Salines, DO   Other Instructions   Sleep Studies A sleep study (polysomnogram) is a series of tests done while you are sleeping. A sleep study records your brain waves, heart rate, breathing rate, oxygen level, and eye and leg movements. A sleep study helps your health care provider:  See how well you sleep.  Diagnose a sleep disorder.  Determine how severe your sleep disorder is.  Create a plan to treat your sleep disorder. Your health care provider may recommend a sleep study if you:  Feel sleepy on most days.  Snore loudly while sleeping.  Have unusual behaviors while you sleep, such as walking.  Have brief periods in which you stop breathing during sleep (sleepapnea).  Fall asleep suddenly during the day (narcolepsy).  Have trouble falling asleep or staying asleep (insomnia).  Feel like you need to move your legs when trying to fall asleep (restless legs syndrome).  Move your legs by flexing and extending them regularly while asleep (periodic limb movement disorder).  Act out your dreams while you sleep (sleep behavior disorder).  Feel like you cannot move when you first wake up (sleep paralysis). What tests are part of a sleep study? Most sleep studies record the following during sleep:  Brain activity.  Eye movements.  Heart rate and rhythm.  Breathing rate and rhythm.  Blood-oxygen level.  Blood pressure.  Chest and belly movement as you breathe.  Arm and leg movements.  Snoring or other noises.  Body position. Where are sleep studies done? Sleep studies are done at sleep centers. A sleep center may be inside a hospital, office, or clinic. The room where you have the study may look like a hospital room or a hotel room. The health care providers doing the study may come in and out of the room during  the study. Most of the time, they will be in another room monitoring your test as you sleep. How are sleep studies  done? Most sleep studies are done during a normal period of time for a full night of sleep. You will arrive at the study center in the evening and go home in the morning. Before the test  Bring your pajamas and toothbrush with you to the sleep study.  Do not have caffeine on the day of your sleep study.  Do not drink alcohol on the day of your sleep study.  Your health care provider will let you know if you should stop taking any of your regular medicines before the test. During the test      Round, sticky patches with sensors attached to recording wires (electrodes) are placed on your scalp, face, chest, and limbs.  Wires from all the electrodes and sensors run from your bed to a computer. The wires can be taken off and put back on if you need to get out of bed to go to the bathroom.  A sensor is placed over your nose to measure airflow.  A finger clip is put on your finger or ear to measure your blood oxygen level (pulse oximetry).  A belt is placed around your belly and a belt is placed around your chest to measure breathing movements.  If you have signs of the sleep disorder called sleep apnea during your test, you may get a treatment mask to wear for the second half of the night. ? The mask provides positive airway pressure (PAP) to help you breathe better during sleep. This may greatly improve your sleep apnea. ? You will then have all tests done again with the mask in place to see if your measurements and recordings change. After the test  A medical doctor who specializes in sleep will evaluate the results of your sleep study and share them with you and your primary health care provider.  Based on your results, your medical history, and a physical exam, you may be diagnosed with a sleep disorder, such as: ? Sleep apnea. ? Restless legs syndrome. ? Sleep-related behavior disorder. ? Sleep-related movement disorders. ? Sleep-related seizure disorders.  Your health care  team will help determine your treatment options based on your diagnosis. This may include: ? Improving your sleep habits (sleep hygiene). ? Wearing a continuous positive airway pressure (CPAP) or bi-level positive airway pressure (BPAP) mask. ? Wearing an oral device at night to improve breathing and reduce snoring. ? Taking medicines. Follow these instructions at home:  Take over-the-counter and prescription medicines only as told by your health care provider.  If you are instructed to use a CPAP or BPAP mask, make sure you use it nightly as directed.  Make any lifestyle changes that your health care provider recommends.  If you were given a device to open your airway while you sleep, use it only as told by your health care provider.  Do not use any tobacco products, such as cigarettes, chewing tobacco, and e-cigarettes. If you need help quitting, ask your health care provider.  Keep all follow-up visits as told by your health care provider. This is important. Summary  A sleep study (polysomnogram) is a series of tests done while you are sleeping. It shows how well you sleep.  Most sleep studies are done over one full night of sleep. You will arrive at the study center in the evening and go home in the morning.  If  you have signs of the sleep disorder called sleep apnea during your test, you may get a treatment mask to wear for the second half of the night.  A medical doctor who specializes in sleep will evaluate the results of your sleep study and share them with your primary health care provider. This information is not intended to replace advice given to you by your health care provider. Make sure you discuss any questions you have with your health care provider. Document Revised: 10/22/2018 Document Reviewed: 06/04/2017 Elsevier Patient Education  [place].      Adopting a Healthy Lifestyle.  Know what a healthy weight is for you (roughly BMI <25) and aim to  maintain this   Aim for 7+ servings of fruits and vegetables daily   65-80+ fluid ounces of water or unsweet tea for healthy kidneys   Limit to max 1 drink of alcohol per day; avoid smoking/tobacco   Limit animal fats in diet for cholesterol and heart health - choose grass fed whenever available   Avoid highly processed foods, and foods high in saturated/trans fats   Aim for low stress - take time to unwind and care for your mental health   Aim for 150 min of moderate intensity exercise weekly for heart health, and weights twice weekly for bone health   Aim for 7-9 hours of sleep daily   When it comes to diets, agreement about the perfect plan isnt easy to find, even among the experts. Experts at the [employer] developed an idea known as the Healthy Eating Plate. Just imagine a plate divided into logical, healthy portions.   The emphasis is on diet quality:   Load up on vegetables and fruits - one-half of your plate: Aim for color and variety, and remember that potatoes dont count.   Go for whole grains - one-quarter of your plate: Whole wheat, barley, wheat berries, quinoa, oats, brown rice, and foods made with them. If you want pasta, go with whole wheat pasta.   Protein power - one-quarter of your plate: Fish, chicken, beans, and nuts are all healthy, versatile protein sources. Limit red meat.   The diet, however, does go beyond the plate, offering a few other suggestions.   Use healthy plant oils, such as olive, canola, soy, corn, sunflower and peanut. Check the labels, and avoid partially hydrogenated oil, which have unhealthy trans fats.   If youre thirsty, drink water. Coffee and tea are good in moderation, but skip sugary drinks and limit milk and dairy products to one or two daily servings.   The type of carbohydrate in the diet is more important than the amount. Some sources of carbohydrates, such as vegetables, fruits, whole grains, and beans-are  healthier than others.   Finally, stay active  Signed, Berniece Salines, DO  10/26/2019 2:54 PM    [facility]

## 2019-11-03 ENCOUNTER — Telehealth: Payer: Self-pay

## 2019-11-03 LAB — TSH: TSH: 1.01 u[IU]/mL (ref 0.450–4.500)

## 2019-11-03 LAB — CBC
Hematocrit: 38.6 % (ref 37.5–51.0)
Hemoglobin: 13.1 g/dL (ref 13.0–17.7)
MCH: 31.3 pg (ref 26.6–33.0)
MCHC: 33.9 g/dL (ref 31.5–35.7)
MCV: 92 fL (ref 79–97)
Platelets: 225 10*3/uL (ref 150–450)
RBC: 4.18 x10E6/uL (ref 4.14–5.80)
RDW: 13.1 % (ref 11.6–15.4)
WBC: 6.2 10*3/uL (ref 3.4–10.8)

## 2019-11-03 LAB — BASIC METABOLIC PANEL
BUN/Creatinine Ratio: 15 (ref 10–24)
BUN: 16 mg/dL (ref 8–27)
CO2: 28 mmol/L (ref 20–29)
Calcium: 8.8 mg/dL (ref 8.6–10.2)
Chloride: 104 mmol/L (ref 96–106)
Creatinine, Ser: 1.08 mg/dL (ref 0.76–1.27)
GFR calc Af Amer: 77 mL/min/{1.73_m2} (ref >59)
GFR calc non Af Amer: 66 mL/min/{1.73_m2} (ref >59)
Glucose: 97 mg/dL (ref 65–99)
Potassium: 3.9 mmol/L (ref 3.5–5.2)
Sodium: 143 mmol/L (ref 134–144)

## 2019-11-03 LAB — VITAMIN D 1,25 DIHYDROXY
Vitamin D 1, 25 (OH)2 Total: 29 pg/mL
Vitamin D2 1, 25 (OH)2: <10 pg/mL
Vitamin D3 1, 25 (OH)2: 29 pg/mL

## 2019-11-03 LAB — MAGNESIUM: Magnesium: 2.1 mg/dL (ref 1.6–2.3)

## 2019-11-03 NOTE — Telephone Encounter (Signed)
-----   Message from Richardo Priest, MD sent at 11/03/2019  9:48 AM EDT ----- Normal or stable result  No changes

## 2019-11-03 NOTE — Telephone Encounter (Signed)
Spoke with patients wife regarding results and recommendation.  She verbalizes understanding and is agreeable to plan of care. Advised for the patient to call back with any issues or concerns.

## 2019-11-12 ENCOUNTER — Other Ambulatory Visit: Payer: Medicare HMO

## 2019-11-26 ENCOUNTER — Other Ambulatory Visit: Payer: Medicare HMO

## 2019-12-18 ENCOUNTER — Other Ambulatory Visit: Payer: Self-pay

## 2019-12-18 ENCOUNTER — Ambulatory Visit (INDEPENDENT_AMBULATORY_CARE_PROVIDER_SITE_OTHER): Payer: Medicare HMO

## 2019-12-18 DIAGNOSIS — R5383 Other fatigue: Secondary | ICD-10-CM

## 2019-12-18 DIAGNOSIS — I2510 Atherosclerotic heart disease of native coronary artery without angina pectoris: Secondary | ICD-10-CM

## 2019-12-18 LAB — ECHOCARDIOGRAM COMPLETE
Area-P 1/2: 4.8 cm2
S' Lateral: 4.1 cm

## 2019-12-18 NOTE — Progress Notes (Signed)
Complete echocardiogram performed.  Jimmy Janasia Coverdale RDCS, RVT  

## 2020-01-21 ENCOUNTER — Ambulatory Visit: Payer: Medicare HMO | Admitting: Cardiology

## 2020-01-27 ENCOUNTER — Other Ambulatory Visit: Payer: Self-pay | Admitting: Cardiology

## 2020-02-08 DIAGNOSIS — U071 COVID-19: Secondary | ICD-10-CM

## 2020-02-08 HISTORY — DX: COVID-19: U07.1

## 2020-02-10 ENCOUNTER — Other Ambulatory Visit: Payer: Self-pay | Admitting: Cardiology

## 2020-02-18 ENCOUNTER — Ambulatory Visit (INDEPENDENT_AMBULATORY_CARE_PROVIDER_SITE_OTHER): Payer: Medicare HMO | Admitting: Cardiology

## 2020-02-18 ENCOUNTER — Other Ambulatory Visit: Payer: Self-pay

## 2020-02-18 ENCOUNTER — Telehealth: Payer: Self-pay | Admitting: Cardiology

## 2020-02-18 ENCOUNTER — Ambulatory Visit: Payer: Medicare HMO | Admitting: Cardiology

## 2020-02-18 ENCOUNTER — Encounter: Payer: Self-pay | Admitting: Cardiology

## 2020-02-18 VITALS — BP 142/70 | HR 61 | Ht 72.0 in | Wt 196.0 lb

## 2020-02-18 DIAGNOSIS — Z8616 Personal history of COVID-19: Secondary | ICD-10-CM

## 2020-02-18 DIAGNOSIS — I10 Essential (primary) hypertension: Secondary | ICD-10-CM

## 2020-02-18 DIAGNOSIS — E782 Mixed hyperlipidemia: Secondary | ICD-10-CM | POA: Diagnosis not present

## 2020-02-18 DIAGNOSIS — I2510 Atherosclerotic heart disease of native coronary artery without angina pectoris: Secondary | ICD-10-CM

## 2020-02-18 HISTORY — DX: Personal history of COVID-19: Z86.16

## 2020-02-18 NOTE — Telephone Encounter (Signed)
Scheduled patient for follow up visit this morning, 02/18/20 at 10:00 am with Dr. Harriet Masson

## 2020-02-18 NOTE — Patient Instructions (Signed)
Medication Instructions:  Your physician recommends that you continue on your current medications as directed. Please refer to the Current Medication list given to you today.  *If you need a refill on your cardiac medications before your next appointment, please call your pharmacy*   Lab Work: None ordered   If you have labs (blood work) drawn today and your tests are completely normal, you will receive your results only by: Marland Kitchen MyChart Message (if you have MyChart) OR . A paper copy in the mail If you have any lab test that is abnormal or we need to change your treatment, we will call you to review the results.   Testing/Procedures: None ordered    Follow-Up: At [facility], you and your health needs are our priority.  As part of our continuing mission to provide you with exceptional heart care, we have created designated Provider Care Teams.  These Care Teams include your primary Cardiologist (physician) and Advanced Practice Providers (APPs -  Physician Assistants and Nurse Practitioners) who all work together to provide you with the care you need, when you need it.  We recommend signing up for the patient portal called "MyChart".  Sign up information is provided on this After Visit Summary.  MyChart is used to connect with patients for Virtual Visits (Telemedicine).  Patients are able to view lab/test results, encounter notes, upcoming appointments, etc.  Non-urgent messages can be sent to your provider as well.   To learn more about what you can do with MyChart, go to [email or web address].    Your next appointment:   1 month(s)  The format for your next appointment:   In Person  Provider:   Berniece Salines, DO   Other Instructions None

## 2020-02-18 NOTE — Progress Notes (Signed)
Cardiology Office Note:    Date:  02/18/2020   ID:  [name], DOB [birth year], MRN [id number]  PCP:  Jay Greenhouse, MD  Cardiologist:  Jay Salines, DO  Electrophysiologist:  None   Referring MD: Jay Greenhouse, MD    Follow-up visit  History of Present Illness:    [name] is a 79 y.o. male with a hx of of coronary artery disease with PCI 2014 drug-eluting stent to the LAD diagonal and right coronary artery as well as hypertension and dyslipidemia.  I last saw the patient on November 25, 2019 at that time he reported significant fatigue I recommended patient undergo a sleep study but the patient declined.  His medications were not changed.  In the interim he did get diagnosed with COVID-19 infection.  He has recovered well. He tells me that he did the outpatient infusion for his Covid infection.  He is very thankful he denies any shortness of breath which tells me is improved significantly and chest pain.   Past Medical History:  Diagnosis Date  . Adenomatous polyp of sigmoid colon 03/18/2019   Formatting of this note might be different from the original. 2020: tubular adenoma  . Allergic rhinitis 08/05/2018  . Arthritis   . Benign essential hypertension 04/05/2016  . Bradycardia by electrocardiogram 11/29/2017  . CAD (coronary artery disease), native coronary artery 04/05/2016   Overview:  Formatting of this note may be different from the original.      11/2012: DES of LAD, D2 and RCA   . Cancer ([place])   . Dysrhythmia    IRREG HEARTBEAT  . 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 Erectile dysfunction 04/05/2016  . 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 Hypertension   . 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 Jaundice    AGE 56  . Mixed hyperlipidemia 07/01/2019  . Osteoarthritis of hip 07/21/2015  . 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 PONV (postoperative nausea and vomiting)   . 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   Overview:   2018  . Postoperative examination 04/24/2018  . Preoperative cardiovascular examination 05/05/2015  . 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  . Respiratory infection 05/09/2018  . 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 Suspected COVID-19 virus infection 04/23/2019    Past Surgical History:  Procedure Laterality Date  . CARDIAC CATHETERIZATION    . CHOLECYSTECTOMY     2002 [place]  . COLON SURGERY     04/2012 [place]  . CORONARY ANGIOPLASTY     HIGH PT REGIONAL  2014  . HIP ARTHROPLASTY     RIGHT HIP  2012  . TOTAL HIP ARTHROPLASTY Left 07/21/2015   Procedure: LEFT TOTAL HIP ARTHROPLASTY ANTERIOR APPROACH;  Surgeon: Mcarthur Rossetti, MD;  Location: [facility];  Service: Orthopedics;  Laterality: Left;    Current Medications: Current Meds  Medication Sig  . Ascorbic Acid (VITAMIN C) 1000 MG tablet Take 1,000 mg by mouth daily.  . carvedilol (COREG) 6.25 MG tablet Take 1 tablet (6.25 mg total) by mouth 2 (two) times daily.  . clopidogrel (PLAVIX) 75 MG tablet Take 1 tablet (75 mg total) by mouth daily.  Marland Kitchen dutasteride (AVODART) 0.5 MG capsule Take 1 capsule by mouth daily.  . fluticasone (FLONASE) 50 MCG/ACT nasal spray Place 1 spray into both nostrils daily.  . furosemide (LASIX) 40 MG tablet TAKE 1 TABLET (40 MG  TOTAL) BY MOUTH DAILY.  Marland Kitchen gabapentin (NEURONTIN) 100 MG capsule Take 100 mg by mouth as needed (pain).   . hydrALAZINE (APRESOLINE) 50 MG tablet TAKE 1 TABLET THREE TIMES DAILY  . hydrochlorothiazide (MICROZIDE) 12.5 MG capsule TAKE 1 CAPSULE(12.5 MG) BY MOUTH DAILY  . Multiple Vitamin (MULTIVITAMIN WITH MINERALS) TABS tablet Take 1 tablet by mouth daily.  . nitroGLYCERIN (NITROSTAT) 0.4 MG SL tablet Place 1 tablet (0.4 mg total) under the tongue every 5 (five) minutes as needed for chest pain.  . simvastatin (ZOCOR) 40 MG tablet TAKE 1 TABLET EVERY EVENING (NEED MD APPOINTMENT)  . tamsulosin  (FLOMAX) 0.4 MG CAPS capsule Take 0.4 mg by mouth 2 (two) times daily.   . Vitamins/Minerals TABS Take 1 tablet by mouth daily.     Allergies:   Patient has no known allergies.   Social History   Socioeconomic History  . Marital status: Married    Spouse name: Not on file  . Number of children: Not on file  . Years of education: Not on file  . Highest education level: Not on file  Occupational History  . Not on file  Tobacco Use  . Smoking status: Former Smoker    Packs/day: 1.00    Types: Cigarettes    Quit date: 2000    Years since quitting: 21.7  . Smokeless tobacco: Never Used  Vaping Use  . Vaping Use: Never used  Substance and Sexual Activity  . Alcohol use: No  . Drug use: No  . Sexual activity: Not on file  Other Topics Concern  . Not on file  Social History Narrative  . Not on file   Social Determinants of Health   Financial Resource Strain:   . Difficulty of Paying Living Expenses: Not on file  Food Insecurity:   . Worried About Charity fundraiser in the Last Year: Not on file  . Ran Out of Food in the Last Year: Not on file  Transportation Needs:   . Lack of Transportation (Medical): Not on file  . Lack of Transportation (Non-Medical): Not on file  Physical Activity:   . Days of Exercise per Week: Not on file  . Minutes of Exercise per Session: Not on file  Stress:   . Feeling of Stress : Not on file  Social Connections:   . Frequency of Communication with Friends and Family: Not on file  . Frequency of Social Gatherings with Friends and Family: Not on file  . Attends Religious Services: Not on file  . Active Member of Clubs or Organizations: Not on file  . Attends Archivist Meetings: Not on file  . Marital Status: Not on file     Family History: The patient's family history includes Colon cancer in his mother; Heart disease in his father.  ROS:   Review of Systems  Constitution: Negative for decreased appetite, fever and weight  gain.  HENT: Negative for congestion, ear discharge, hoarse voice and sore throat.   Eyes: Negative for discharge, redness, vision loss in right eye and visual halos.  Cardiovascular: Negative for chest pain, dyspnea on exertion, leg swelling, orthopnea and palpitations.  Respiratory: Negative for cough, hemoptysis, shortness of breath and snoring.   Endocrine: Negative for heat intolerance and polyphagia.  Hematologic/Lymphatic: Negative for bleeding problem. Does not bruise/bleed easily.  Skin: Negative for flushing, nail changes, rash and suspicious lesions.  Musculoskeletal: Negative for arthritis, joint pain, muscle cramps, myalgias, neck pain and stiffness.  Gastrointestinal: Negative for  abdominal pain, bowel incontinence, diarrhea and excessive appetite.  Genitourinary: Negative for decreased libido, genital sores and incomplete emptying.  Neurological: Negative for brief paralysis, focal weakness, headaches and loss of balance.  Psychiatric/Behavioral: Negative for altered mental status, depression and suicidal ideas.  Allergic/Immunologic: Negative for HIV exposure and persistent infections.    EKGs/Labs/Other Studies Reviewed:    The following studies were reviewed today:   EKG: None today  Recent Labs: 02/26/2019: ALT 13 10/26/2019: BUN 16; Creatinine, Ser 1.08; Hemoglobin 13.1; Magnesium 2.1; Platelets 225; Potassium 3.9; Sodium 143; TSH 1.010  Recent Lipid Panel    Component Value Date/Time   CHOL 137 02/26/2019 1553   TRIG 240 (H) 02/26/2019 1553   HDL 50 02/26/2019 1553   CHOLHDL 2.7 02/26/2019 1553   LDLCALC 49 02/26/2019 1553    Physical Exam:    VS:  BP (!) 142/70   Pulse 61   Ht 6' (1.829 m)   Wt 196 lb (88.9 kg)   SpO2 96%   BMI 26.58 kg/m     Wt Readings from Last 3 Encounters:  02/18/20 196 lb (88.9 kg)  10/26/19 209 lb (94.8 kg)  07/01/19 208 lb (94.3 kg)     GEN: Well nourished, well developed in no acute distress HEENT: Normal NECK: No JVD;  No carotid bruits LYMPHATICS: No lymphadenopathy CARDIAC: S1S2 noted,RRR, no murmurs, rubs, gallops RESPIRATORY:  Clear to auscultation without rales, wheezing or rhonchi  ABDOMEN: Soft, non-tender, non-distended, +bowel sounds, no guarding. EXTREMITIES: No edema, No cyanosis, no clubbing MUSCULOSKELETAL:  No deformity  SKIN: Warm and dry NEUROLOGIC:  Alert and oriented x 3, non-focal PSYCHIATRIC:  Normal affect, good insight  ASSESSMENT:    1. Benign essential hypertension   2. Coronary artery disease involving native coronary artery of native heart without angina pectoris   3. History of COVID-19   4. Mixed hyperlipidemia    PLAN:    He appears to be doing well from a cardiovascular standpoint.  The only thing is I have to watch his blood pressure is slightly elevated today but in light of his recent infection we will hold off on increasing his regimen.  He tells me at home his systolics has been in the 130s.  Continue patient on the antiplatelet regimen with his Plavix as well as his simvastatin.  The patient is in agreement with the above plan. The patient left the office in stable condition.  The patient will follow up in   Medication Adjustments/Labs and Tests Ordered: Current medicines are reviewed at length with the patient today.  Concerns regarding medicines are outlined above.  No orders of the defined types were placed in this encounter.  No orders of the defined types were placed in this encounter.   Patient Instructions  Medication Instructions:  Your physician recommends that you continue on your current medications as directed. Please refer to the Current Medication list given to you today.  *If you need a refill on your cardiac medications before your next appointment, please call your pharmacy*   Lab Work: None ordered   If you have labs (blood work) drawn today and your tests are completely normal, you will receive your results only by: Marland Kitchen MyChart  Message (if you have MyChart) OR . A paper copy in the mail If you have any lab test that is abnormal or we need to change your treatment, we will call you to review the results.   Testing/Procedures: None ordered    Follow-Up: At [facility],  you and your health needs are our priority.  As part of our continuing mission to provide you with exceptional heart care, we have created designated Provider Care Teams.  These Care Teams include your primary Cardiologist (physician) and Advanced Practice Providers (APPs -  Physician Assistants and Nurse Practitioners) who all work together to provide you with the care you need, when you need it.  We recommend signing up for the patient portal called "MyChart".  Sign up information is provided on this After Visit Summary.  MyChart is used to connect with patients for Virtual Visits (Telemedicine).  Patients are able to view lab/test results, encounter notes, upcoming appointments, etc.  Non-urgent messages can be sent to your provider as well.   To learn more about what you can do with MyChart, go to [email or web address].    Your next appointment:   1 month(s)  The format for your next appointment:   In Person  Provider:   Berniece Salines, DO   Other Instructions None      Adopting a Healthy Lifestyle.  Know what a healthy weight is for you (roughly BMI <25) and aim to maintain this   Aim for 7+ servings of fruits and vegetables daily   65-80+ fluid ounces of water or unsweet tea for healthy kidneys   Limit to max 1 drink of alcohol per day; avoid smoking/tobacco   Limit animal fats in diet for cholesterol and heart health - choose grass fed whenever available   Avoid highly processed foods, and foods high in saturated/trans fats   Aim for low stress - take time to unwind and care for your mental health   Aim for 150 min of moderate intensity exercise weekly for heart health, and weights twice weekly for bone health   Aim for  7-9 hours of sleep daily   When it comes to diets, agreement about the perfect plan isnt easy to find, even among the experts. Experts at the [employer] developed an idea known as the Healthy Eating Plate. Just imagine a plate divided into logical, healthy portions.   The emphasis is on diet quality:   Load up on vegetables and fruits - one-half of your plate: Aim for color and variety, and remember that potatoes dont count.   Go for whole grains - one-quarter of your plate: Whole wheat, barley, wheat berries, quinoa, oats, brown rice, and foods made with them. If you want pasta, go with whole wheat pasta.   Protein power - one-quarter of your plate: Fish, chicken, beans, and nuts are all healthy, versatile protein sources. Limit red meat.   The diet, however, does go beyond the plate, offering a few other suggestions.   Use healthy plant oils, such as olive, canola, soy, corn, sunflower and peanut. Check the labels, and avoid partially hydrogenated oil, which have unhealthy trans fats.   If youre thirsty, drink water. Coffee and tea are good in moderation, but skip sugary drinks and limit milk and dairy products to one or two daily servings.   The type of carbohydrate in the diet is more important than the amount. Some sources of carbohydrates, such as vegetables, fruits, whole grains, and beans-are healthier than others.   Finally, stay active  Signed, Jay Salines, DO  02/18/2020 10:53 AM    [place]

## 2020-03-12 ENCOUNTER — Other Ambulatory Visit: Payer: Self-pay | Admitting: Cardiology

## 2020-03-15 ENCOUNTER — Other Ambulatory Visit: Payer: Self-pay

## 2020-03-15 DIAGNOSIS — S41112A Laceration without foreign body of left upper arm, initial encounter: Secondary | ICD-10-CM

## 2020-03-15 HISTORY — DX: Laceration without foreign body of left upper arm, initial encounter: S41.112A

## 2020-03-17 ENCOUNTER — Ambulatory Visit: Payer: Medicare HMO | Admitting: Cardiology

## 2020-04-13 ENCOUNTER — Other Ambulatory Visit: Payer: Self-pay | Admitting: Cardiology

## 2020-04-18 ENCOUNTER — Other Ambulatory Visit: Payer: Self-pay | Admitting: Cardiology

## 2020-04-18 NOTE — Telephone Encounter (Signed)
Rx request sent to pharmacy.  

## 2020-04-18 NOTE — Telephone Encounter (Signed)
Rx refill sent to pharmacy. 

## 2020-04-20 ENCOUNTER — Other Ambulatory Visit: Payer: Self-pay | Admitting: Cardiology

## 2020-04-21 NOTE — Telephone Encounter (Signed)
Rx refill sent to pharmacy. 

## 2020-05-27 ENCOUNTER — Other Ambulatory Visit: Payer: Self-pay | Admitting: Family

## 2020-05-27 NOTE — Telephone Encounter (Signed)
Refill request

## 2020-06-08 ENCOUNTER — Other Ambulatory Visit: Payer: Self-pay | Admitting: Cardiology

## 2020-06-21 ENCOUNTER — Other Ambulatory Visit: Payer: Self-pay | Admitting: Cardiology

## 2020-07-04 ENCOUNTER — Other Ambulatory Visit: Payer: Self-pay | Admitting: Cardiology

## 2020-08-17 ENCOUNTER — Other Ambulatory Visit: Payer: Self-pay | Admitting: Cardiology

## 2020-08-17 NOTE — Telephone Encounter (Signed)
Refill sent to pharmacy.   

## 2020-08-18 ENCOUNTER — Telehealth: Payer: Self-pay | Admitting: Cardiology

## 2020-08-18 MED ORDER — NITROGLYCERIN 0.4 MG SL SUBL: 0.4000 mg | SUBLINGUAL_TABLET | SUBLINGUAL | 3 refills | Status: DC | PRN

## 2020-08-18 NOTE — Telephone Encounter (Signed)
*  STAT* If patient is at the pharmacy, call can be transferred to refill team.   1. Which medications need to be refilled? (please list name of each medication and dose if known) nitroGLYCERIN (NITROSTAT) 0.4 MG SL tablet  2. Which pharmacy/location (including [place] and city if local pharmacy) is medication to be sent to? [id number] - [place], [address] AT [address]  3. Do they need a 30 day or 90 day supply? 90 day supply  Prescription he has, has turned to powder.

## 2020-08-18 NOTE — Telephone Encounter (Signed)
Refill sent in per request.  

## 2020-09-07 ENCOUNTER — Ambulatory Visit: Payer: Self-pay

## 2020-09-07 ENCOUNTER — Ambulatory Visit (INDEPENDENT_AMBULATORY_CARE_PROVIDER_SITE_OTHER): Payer: Medicare HMO | Admitting: Orthopaedic Surgery

## 2020-09-07 ENCOUNTER — Ambulatory Visit (INDEPENDENT_AMBULATORY_CARE_PROVIDER_SITE_OTHER): Payer: Medicare HMO

## 2020-09-07 DIAGNOSIS — M25561 Pain in right knee: Secondary | ICD-10-CM | POA: Diagnosis not present

## 2020-09-07 DIAGNOSIS — M25562 Pain in left knee: Secondary | ICD-10-CM | POA: Diagnosis not present

## 2020-09-07 DIAGNOSIS — G8929 Other chronic pain: Secondary | ICD-10-CM

## 2020-09-07 MED ORDER — METHYLPREDNISOLONE ACETATE 40 MG/ML IJ SUSP
40.0000 mg | INTRAMUSCULAR | Status: AC | PRN
  Administered 2020-09-07: 40 mg via INTRA_ARTICULAR

## 2020-09-07 MED ORDER — LIDOCAINE HCL 1 % IJ SOLN
3.0000 mL | INTRAMUSCULAR | Status: AC | PRN
  Administered 2020-09-07: 3 mL

## 2020-09-07 NOTE — Progress Notes (Signed)
Office Visit Note   Patient: [name]           Date of Birth: 12-31-1940           MRN: [id number] Visit Date: 09/07/2020              Requested by: Algis Greenhouse, MD [address] [place],  [place] [address] PCP: Algis Greenhouse, MD   Assessment & Plan: Visit Diagnoses:  1. Chronic pain of left knee   2. Chronic pain of right knee     Plan: I agree with the patient's request for steroid injections in both knees.  He tolerated these well.  I recommended Voltaren gel as well.  He should try quad strengthening exercises.  Tylenol arthritis could help and I requested he try this.  All questions and concerns were answered addressed.  He knows to wait least 3 to 4 months between steroid injections.  Follow-up is otherwise as needed.  Follow-Up Instructions: Return if symptoms worsen or fail to improve.   Orders:  Orders Placed This Encounter  Procedures  . Large Joint Inj  . Large Joint Inj  . XR Knee 1-2 Views Left  . XR Knee 1-2 Views Right   No orders of the defined types were placed in this encounter.     Procedures: Large Joint Inj: R knee on 09/07/2020 9:09 AM Indications: diagnostic evaluation and pain Details: 22 G 1.5 in needle, superolateral approach  Arthrogram: No  Medications: 3 mL lidocaine 1 %; 40 mg methylPREDNISolone acetate 40 MG/ML Outcome: tolerated well, no immediate complications Procedure, treatment alternatives, risks and benefits explained, specific risks discussed. Consent was given by the patient. Immediately prior to procedure a time out was called to verify the correct patient, procedure, equipment, support staff and site/side marked as required. Patient was prepped and draped in the usual sterile fashion.   Large Joint Inj: L knee on 09/07/2020 9:09 AM Indications: diagnostic evaluation and pain Details: 22 G 1.5 in needle, superolateral approach  Arthrogram: No  Medications: 3 mL lidocaine 1 %; 40 mg methylPREDNISolone acetate 40  MG/ML Outcome: tolerated well, no immediate complications Procedure, treatment alternatives, risks and benefits explained, specific risks discussed. Consent was given by the patient. Immediately prior to procedure a time out was called to verify the correct patient, procedure, equipment, support staff and site/side marked as required. Patient was prepped and draped in the usual sterile fashion.       Clinical Data: No additional findings.   Subjective: Chief Complaint  Patient presents with  . Left Knee - Pain  . Right Knee - Pain  The patient comes in today with bilateral knee pain has been going on for a while.  He is requesting steroid injections in both his knees.  He is on Plavix.  He is not a diabetic.  He says a lot of his joints hurt.  He is 80 years old.  He feels like he is now too old to have any type of joint replacements.  He actually has both his hips replaced and I replaced the left 1 years ago.  He has had no other acute change in medical status.  He is never injured these knees but I do grind quite a bit with going up and down stairs and hurt on a daily basis.  HPI  Review of Systems There is currently listed no headache, chest pain, shortness of breath, fever, chills, nausea, vomiting  Objective: Vital Signs: There were no  vitals taken for this visit.  Physical Exam He is alert and orient x3 and in no acute distress Ortho Exam Examination of both knees shows significant patellofemoral crepitation throughout the arc of motion.  Both knees have slight flexion contractures and are globally tender but no effusion.  Both knees are ligamentously stable. Specialty Comments:  No specialty comments available.  Imaging: XR Knee 1-2 Views Left  Result Date: 09/07/2020 2 views left knee show severe patellofemoral arthritic changes and moderate medial lateral compartment arthritic changes.  XR Knee 1-2 Views Right  Result Date: 09/07/2020 2 views of the right knee show  severe patellofemoral arthritic changes and mild to moderate medial lateral compartment arthritic changes.    PMFS History: Patient Active Problem List   Diagnosis Date Noted  . History of COVID-19 02/18/2020  . COVID-19 virus infection 02/08/2020  . Mixed hyperlipidemia 07/01/2019  . Suspected COVID-19 virus infection 04/23/2019  . Adenomatous polyp of sigmoid colon 03/18/2019  . Allergic rhinitis 08/05/2018  . Respiratory infection 05/09/2018  . Postoperative examination 04/24/2018  . 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  . Bradycardia by electrocardiogram 11/29/2017  . 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  . Benign essential hypertension 04/05/2016  . CAD (coronary artery disease), native coronary artery 04/05/2016  . 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 Erectile dysfunction 04/05/2016  . 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 Osteoarthritis of hip 07/21/2015  . Preoperative cardiovascular examination 05/05/2015   Past Medical History:  Diagnosis Date  . Adenomatous polyp of sigmoid colon 03/18/2019   Formatting of this note might be different from the original. 2020: tubular adenoma  . Allergic rhinitis 08/05/2018  . Arthritis   . Benign essential hypertension 04/05/2016  . Bradycardia by electrocardiogram 11/29/2017  . CAD (coronary artery disease), native coronary artery 04/05/2016   Overview:  Formatting of this note may be different from the original.      11/2012: DES of LAD, D2 and RCA   . Cancer ([place])   . COVID-19 virus infection 02/08/2020   Formatting of this note might be different from the original. 02/08/2020 : [facility]-COV  . Dysrhythmia    IRREG HEARTBEAT  . 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 Erectile dysfunction 04/05/2016  . Familial combined  hyperlipidemia 04/05/2016  . History of COVID-19 02/18/2020  . Hypertension   . 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 Jaundice    AGE 21  . Mixed hyperlipidemia 07/01/2019  . Osteoarthritis of hip 07/21/2015  . 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 PONV (postoperative nausea and vomiting)   . 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   Overview:  2018  . Postoperative examination 04/24/2018  . Preoperative cardiovascular examination 05/05/2015  . 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  . Respiratory infection 05/09/2018  . 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 Suspected COVID-19 virus infection 04/23/2019    Family History  Problem Relation Age of Onset  . Colon cancer Mother   . Heart disease Father     Past Surgical History:  Procedure Laterality Date  . CARDIAC CATHETERIZATION    . CHOLECYSTECTOMY     2002 [place]  . COLON SURGERY     04/2012 [place]  . CORONARY ANGIOPLASTY     HIGH PT REGIONAL  2014  .  HIP ARTHROPLASTY     RIGHT HIP  2012  . TOTAL HIP ARTHROPLASTY Left 07/21/2015   Procedure: LEFT TOTAL HIP ARTHROPLASTY ANTERIOR APPROACH;  Surgeon: Mcarthur Rossetti, MD;  Location: [facility];  Service: Orthopedics;  Laterality: Left;   Social History   Occupational History  . Not on file  Tobacco Use  . Smoking status: Former Smoker    Packs/day: 1.00    Types: Cigarettes    Quit date: 2000    Years since quitting: 22.3  . Smokeless tobacco: Never Used  Vaping Use  . Vaping Use: Never used  Substance and Sexual Activity  . Alcohol use: No  . Drug use: No  . Sexual activity: Not on file

## 2020-10-11 ENCOUNTER — Telehealth: Payer: Self-pay | Admitting: Cardiology

## 2020-10-11 NOTE — Telephone Encounter (Signed)
   STAT if HR is under 50 or over 120 (normal HR is 60-100 beats per minute)  1) What is your heart rate? 74-76  2) Do you have a log of your heart rate readings (document readings)?   3) Do you have any other symptoms? Fatigue and a little SOB  Pt's wife said, she noticed pt is always tired and no energy to do anything. She also said pt feels a little sob. She wanted the pt to see Dr. Harriet Masson but they can only go to [place] office. First available is in 06/28 and she said pt needs to be seen sooner than that

## 2020-10-12 ENCOUNTER — Other Ambulatory Visit: Payer: Self-pay

## 2020-10-12 MED ORDER — HYDROCHLOROTHIAZIDE 12.5 MG PO CAPS: ORAL_CAPSULE | ORAL | 0 refills | Status: DC

## 2020-10-12 NOTE — Telephone Encounter (Signed)
Patient added to Dr. Julien Nordmann schedule for June 20th at 3:00 pm. [name] was encouraged to reach out to patient's PCP to have labs drawn and keep the appointment with [place] on 6/20 unless otherwise instructed.

## 2020-10-12 NOTE — Telephone Encounter (Signed)
Refill of HCTZ 12.5 mg sent to [facility].

## 2020-10-12 NOTE — Telephone Encounter (Signed)
We can schedule the first available with any of the other providers - or keep the 6/28 for now. If a spot becomes available we can offer it to the patient. IN the meantime I would recommend her see his pcp.

## 2020-10-13 DIAGNOSIS — R5383 Other fatigue: Secondary | ICD-10-CM | POA: Insufficient documentation

## 2020-10-13 HISTORY — DX: Other fatigue: R53.83

## 2020-10-20 DIAGNOSIS — L0390 Cellulitis, unspecified: Secondary | ICD-10-CM

## 2020-10-20 DIAGNOSIS — D509 Iron deficiency anemia, unspecified: Secondary | ICD-10-CM | POA: Insufficient documentation

## 2020-10-20 HISTORY — DX: Cellulitis, unspecified: L03.90

## 2020-10-20 HISTORY — DX: Iron deficiency anemia, unspecified: D50.9

## 2020-10-29 ENCOUNTER — Other Ambulatory Visit: Payer: Self-pay | Admitting: Cardiology

## 2020-11-04 ENCOUNTER — Other Ambulatory Visit: Payer: Self-pay

## 2020-11-04 DIAGNOSIS — M1990 Unspecified osteoarthritis, unspecified site: Secondary | ICD-10-CM | POA: Insufficient documentation

## 2020-11-04 DIAGNOSIS — D045 Carcinoma in situ of skin of trunk: Secondary | ICD-10-CM | POA: Insufficient documentation

## 2020-11-04 DIAGNOSIS — I499 Cardiac arrhythmia, unspecified: Secondary | ICD-10-CM | POA: Insufficient documentation

## 2020-11-04 DIAGNOSIS — I10 Essential (primary) hypertension: Secondary | ICD-10-CM | POA: Insufficient documentation

## 2020-11-04 DIAGNOSIS — C801 Malignant (primary) neoplasm, unspecified: Secondary | ICD-10-CM | POA: Insufficient documentation

## 2020-11-04 DIAGNOSIS — Z9889 Other specified postprocedural states: Secondary | ICD-10-CM | POA: Insufficient documentation

## 2020-11-04 DIAGNOSIS — R17 Unspecified jaundice: Secondary | ICD-10-CM | POA: Insufficient documentation

## 2020-11-07 ENCOUNTER — Ambulatory Visit: Payer: Medicare HMO | Admitting: Cardiology

## 2020-11-07 ENCOUNTER — Encounter: Payer: Self-pay | Admitting: Cardiology

## 2020-11-07 ENCOUNTER — Ambulatory Visit (INDEPENDENT_AMBULATORY_CARE_PROVIDER_SITE_OTHER): Payer: Medicare HMO

## 2020-11-07 ENCOUNTER — Other Ambulatory Visit: Payer: Self-pay

## 2020-11-07 VITALS — BP 132/80 | HR 60 | Ht 72.0 in | Wt 193.2 lb

## 2020-11-07 DIAGNOSIS — Z01818 Encounter for other preprocedural examination: Secondary | ICD-10-CM

## 2020-11-07 DIAGNOSIS — R011 Cardiac murmur, unspecified: Secondary | ICD-10-CM | POA: Insufficient documentation

## 2020-11-07 DIAGNOSIS — R002 Palpitations: Secondary | ICD-10-CM

## 2020-11-07 DIAGNOSIS — Z01810 Encounter for preprocedural cardiovascular examination: Secondary | ICD-10-CM | POA: Insufficient documentation

## 2020-11-07 DIAGNOSIS — E782 Mixed hyperlipidemia: Secondary | ICD-10-CM

## 2020-11-07 DIAGNOSIS — I2510 Atherosclerotic heart disease of native coronary artery without angina pectoris: Secondary | ICD-10-CM

## 2020-11-07 DIAGNOSIS — I10 Essential (primary) hypertension: Secondary | ICD-10-CM | POA: Diagnosis not present

## 2020-11-07 HISTORY — DX: Palpitations: R00.2

## 2020-11-07 HISTORY — DX: Cardiac murmur, unspecified: R01.1

## 2020-11-07 HISTORY — DX: Encounter for preprocedural cardiovascular examination: Z01.810

## 2020-11-07 NOTE — Patient Instructions (Signed)
Medication Instructions:  No medication changes. *If you need a refill on your cardiac medications before your next appointment, please call your pharmacy*   Lab Work: None ordered If you have labs (blood work) drawn today and your tests are completely normal, you will receive your results only by: [facility] (if you have MyChart) OR A paper copy in the mail If you have any lab test that is abnormal or we need to change your treatment, we will call you to review the results.   Testing/Procedures: Your physician has requested that you have a lexiscan myoview. For further information please visit [email or web address]. Please follow instruction sheet, as given.  The test will take approximately 3 to 4 hours to complete; you may bring reading material.  If someone comes with you to your appointment, they will need to remain in the main lobby due to limited space in the testing area.   How to prepare for your Myocardial Perfusion Test: Do not eat or drink 3 hours prior to your test, except you may have water. Do not consume products containing caffeine (regular or decaffeinated) 12 hours prior to your test. (ex: coffee, chocolate, sodas, tea). Do bring a list of your current medications with you.  If not listed below, you may take your medications as normal. Do wear comfortable clothes (no dresses or overalls) and walking shoes, tennis shoes preferred (No heels or open toe shoes are allowed). Do NOT wear cologne, perfume, aftershave, or lotions (deodorant is allowed). If these instructions are not followed, your test will have to be rescheduled.  Your physician has requested that you have an echocardiogram. Echocardiography is a painless test that uses sound waves to create images of your heart. It provides your doctor with information about the size and shape of your heart and how well your heart's chambers and valves are working. This procedure takes approximately one hour. There are no  restrictions for this procedure.   WHY IS MY DOCTOR PRESCRIBING ZIO? The Zio system is proven and trusted by physicians to detect and diagnose irregular heart rhythms -- and has been prescribed to hundreds of thousands of patients.  The FDA has cleared the Zio system to monitor for many different kinds of irregular heart rhythms. In a study, physicians were able to reach a diagnosis 90% of the time with the Zio system1.  You can wear the Zio monitor -- a small, discreet, comfortable patch -- during your normal day-to-day activity, including while you sleep, shower, and exercise, while it records every single heartbeat for analysis.  1Barrett, P., et al. Comparison of 24 Hour Holter Monitoring Versus 14 Day Novel Adhesive Patch Electrocardiographic Monitoring. Enders, 2014.  ZIO VS. HOLTER MONITORING The Zio monitor can be comfortably worn for up to 14 days. Holter monitors can be worn for 24 to 48 hours, limiting the time to record any irregular heart rhythms you may have. Zio is able to capture data for the 51% of patients who have their first symptom-triggered arrhythmia after 48 hours.1  LIVE WITHOUT RESTRICTIONS The Zio ambulatory cardiac monitor is a small, unobtrusive, and water-resistant patch--you might even forget you're wearing it. The Zio monitor records and stores every beat of your heart, whether you're sleeping, working out, or showering.  Wear the monitor for 14 days remove 11/21/20.  Follow-Up: At [facility], you and your health needs are our priority.  As part of our continuing mission to provide you with exceptional heart care, we have created designated  Provider Care Teams.  These Care Teams include your primary Cardiologist (physician) and Advanced Practice Providers (APPs -  Physician Assistants and Nurse Practitioners) who all work together to provide you with the care you need, when you need it.  We recommend signing up for the patient portal  called "MyChart".  Sign up information is provided on this After Visit Summary.  MyChart is used to connect with patients for Virtual Visits (Telemedicine).  Patients are able to view lab/test results, encounter notes, upcoming appointments, etc.  Non-urgent messages can be sent to your provider as well.   To learn more about what you can do with MyChart, go to [email or web address].    Your next appointment:   6 month(s)  The format for your next appointment:   In Person  Provider:   Jyl Heinz, MD   Other Instructions Cardiac Nuclear Scan A cardiac nuclear scan is a test that is done to check the flow of blood to your heart. It is done when you are resting and when you are exercising. The test looks for problems such as: Not enough blood reaching a portion of the heart. The heart muscle not working as it should. You may need this test if: You have heart disease. You have had lab results that are not normal. You have had heart surgery or a balloon procedure to open up blocked arteries (angioplasty). You have chest pain. You have shortness of breath. In this test, a special dye (tracer) is put into your bloodstream. The tracer will travel to your heart. A camera will then take pictures of your heart to see how the tracer moves through your heart. This test is usually done at a hospital and takes 2-4 hours. Tell a doctor about: Any allergies you have. All medicines you are taking, including vitamins, herbs, eye drops, creams, and over-the-counter medicines. Any problems you or family members have had with anesthetic medicines. Any blood disorders you have. Any surgeries you have had. Any medical conditions you have. Whether you are pregnant or may be pregnant. What are the risks? Generally, this is a safe test. However, problems may occur, such as: Serious chest pain and heart attack. This is only a risk if the stress portion of the test is done. Rapid heartbeat. A feeling  of warmth in your chest. This feeling usually does not last long. Allergic reaction to the tracer. What happens before the test? Ask your doctor about changing or stopping your normal medicines. This is important. Follow instructions from your doctor about what you cannot eat or drink. Remove your jewelry on the day of the test. What happens during the test? An IV tube will be inserted into one of your veins. Your doctor will give you a small amount of tracer through the IV tube. You will wait for 20-40 minutes while the tracer moves through your bloodstream. Your heart will be monitored with an electrocardiogram (ECG). You will lie down on an exam table. Pictures of your heart will be taken for about 15-20 minutes. You may also have a stress test. For this test, one of these things may be done: You will be asked to exercise on a treadmill or a stationary bike. You will be given medicines that will make your heart work harder. This is done if you are unable to exercise. When blood flow to your heart has peaked, a tracer will again be given through the IV tube. After 20-40 minutes, you will get back on the exam table.  More pictures will be taken of your heart. Depending on the tracer that is used, more pictures may need to be taken 3-4 hours later. Your IV tube will be removed when the test is over. The test may vary among doctors and hospitals. What happens after the test? Ask your doctor: Whether you can return to your normal schedule, including diet, activities, and medicines. Whether you should drink more fluids. This will help to remove the tracer from your body. Drink enough fluid to keep your pee (urine) pale yellow. Ask your doctor, or the department that is doing the test: When will my results be ready? How will I get my results? Summary A cardiac nuclear scan is a test that is done to check the flow of blood to your heart. Tell your doctor whether you are pregnant or may be  pregnant. Before the test, ask your doctor about changing or stopping your normal medicines. This is important. Ask your doctor whether you can return to your normal activities. You may be asked to drink more fluids. This information is not intended to replace advice given to you by your health care provider. Make sure you discuss any questions you have with your health care provider. Document Revised: 08/27/2018 Document Reviewed: 10/21/2017 Elsevier Patient Education  2021 [employer].    Echocardiogram An echocardiogram is a test that uses sound waves (ultrasound) to produce images of the heart. Images from an echocardiogram can provide important information about: Heart size and shape. The size and thickness and movement of your heart's walls. Heart muscle function and strength. Heart valve function or if you have stenosis. Stenosis is when the heart valves are too narrow. If blood is flowing backward through the heart valves (regurgitation). A tumor or infectious growth around the heart valves. Areas of heart muscle that are not working well because of poor blood flow or injury from a heart attack. Aneurysm detection. An aneurysm is a weak or damaged part of an artery wall. The wall bulges out from the normal force of blood pumping through the body. Tell a health care provider about: Any allergies you have. All medicines you are taking, including vitamins, herbs, eye drops, creams, and over-the-counter medicines. Any blood disorders you have. Any surgeries you have had. Any medical conditions you have. Whether you are pregnant or may be pregnant. What are the risks? Generally, this is a safe test. However, problems may occur, including an allergic reaction to dye (contrast) that may be used during the test. What happens before the test? No specific preparation is needed. You may eat and drink normally. What happens during the test? You will take off your clothes from the waist  up and put on a hospital gown. Electrodes or electrocardiogram (ECG)patches may be placed on your chest. The electrodes or patches are then connected to a device that monitors your heart rate and rhythm. You will lie down on a table for an ultrasound exam. A gel will be applied to your chest to help sound waves pass through your skin. A handheld device, called a transducer, will be pressed against your chest and moved over your heart. The transducer produces sound waves that travel to your heart and bounce back (or "echo" back) to the transducer. These sound waves will be captured in real-time and changed into images of your heart that can be viewed on a video monitor. The images will be recorded on a computer and reviewed by your health care provider. You may be asked  to change positions or hold your breath for a short time. This makes it easier to get different views or better views of your heart. In some cases, you may receive contrast through an IV in one of your veins. This can improve the quality of the pictures from your heart. The procedure may vary among health care providers and hospitals.    What can I expect after the test? You may return to your normal, everyday life, including diet, activities, and medicines, unless your health care provider tells you not to do that. Follow these instructions at home: It is up to you to get the results of your test. Ask your health care provider, or the department that is doing the test, when your results will be ready. Keep all follow-up visits. This is important. Summary An echocardiogram is a test that uses sound waves (ultrasound) to produce images of the heart. Images from an echocardiogram can provide important information about the size and shape of your heart, heart muscle function, heart valve function, and other possible heart problems. You do not need to do anything to prepare before this test. You may eat and drink normally. After the  echocardiogram is completed, you may return to your normal, everyday life, unless your health care provider tells you not to do that. This information is not intended to replace advice given to you by your health care provider. Make sure you discuss any questions you have with your health care provider. Document Revised: 12/29/2019 Document Reviewed: 12/29/2019 Elsevier Patient Education  2021 [employer].

## 2020-11-07 NOTE — Progress Notes (Signed)
Cardiology Office Note:    Date:  11/07/2020   ID:  [name], DOB 1940/07/01, MRN [id number]  PCP:  Algis Greenhouse, MD  Cardiologist:  Jenean Lindau, MD   Referring MD: Algis Greenhouse, MD    ASSESSMENT:    1. Coronary artery disease involving native coronary artery of native heart without angina pectoris   2. Palpitations   3. Pre-op evaluation   4. Benign essential hypertension   5. Mixed hyperlipidemia   6. Cardiac murmur   7. Preop cardiovascular exam    PLAN:    In order of problems listed above:  Coronary artery disease: Secondary prevention stressed with the patient.  Importance of compliance with diet medication stressed any vocalized understanding. Preoperative evaluation: He plans to undergo colonoscopy and is sent here for evaluation.  Patient is followed anemia work-up.  Does not exercise much at overall is a poor historian.  I would like to do a Lexiscan sestamibi to assess and make sure he is good for this procedure.  Without this test it is hard to get an objective evaluation with this gentleman.  If the test is negative he is not at high risk for coronary events during the aforementioned surgery.  Meticulous hemodynamic monitoring will further reduce risk of coronary events. Cardiac murmur: Echocardiogram will be done to assess murmur heard on auscultation. Palpitations: TSH is normal and I reviewed the chart and primary care doctor's lab work.  We will also do a 2-week monitor to assess this. Essential hypertension: Blood pressure stable and diet was emphasized. Mixed dyslipidemia: I discussed findings with patient at length.  Lipids followed by primary care.  He is on statin therapy. Anemia: For this reason he has been advised to keep off antiplatelet agents.  Risks explained. Patient will be seen in follow-up appointment in 6 months or earlier if the patient has any concerns    Medication Adjustments/Labs and Tests Ordered: Current medicines are  reviewed at length with the patient today.  Concerns regarding medicines are outlined above.  Orders Placed This Encounter  Procedures   MYOCARDIAL PERFUSION IMAGING   LONG TERM MONITOR (3-14 DAYS)   EKG 12-Lead   ECHOCARDIOGRAM COMPLETE   No orders of the defined types were placed in this encounter.    No chief complaint on file.    History of Present Illness:    [name] is a 80 y.o. male.  Patient has past medical history of coronary artery disease, essential hypertension and dyslipidemia.  He is previously unknown to me.  He has seen my partner in the past.  He has had a stent to his coronary artery in the remote past at [facility].  He is sent here for preop evaluation.  He is planning to undergo colonoscopy for anemia evaluation.  He leads a sedentary lifestyle.  He denies any chest pain orthopnea or PND.  At the time of my evaluation, the patient is alert awake oriented and in no distress.  He also complains of some palpitations like issues on and off.  Past Medical History:  Diagnosis Date   Adenomatous polyp of sigmoid colon 03/18/2019   Formatting of this note might be different from the original. 2020: tubular adenoma   Allergic rhinitis 08/05/2018   Arthritis    Benign essential hypertension 04/05/2016   Bradycardia by electrocardiogram 11/29/2017   CAD (coronary artery disease), native coronary artery 04/05/2016   Overview:  Formatting of this note may be different  from the original.       11/2012:  DES of LAD, D2 and RCA     Cancer ([place])    Cellulitis 10/20/2020   Formatting of this note might be different from the original. 10/20/2020   COVID-19 virus infection 02/08/2020   Formatting of this note might be different from the original. 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   carvedilol (COREG) 6.25 MG tablet Take 1 tablet by mouth 2 (two) times daily.   D-Mannose 500 MG CAPS Take 500 mg by mouth daily.   dutasteride (AVODART) 0.5 MG capsule Take 1 capsule by mouth daily.   gabapentin (NEURONTIN) 100 MG capsule Take 100 mg by mouth as needed (pain).    Multiple Vitamin (MULTIVITAMIN WITH MINERALS) TABS tablet Take 1 tablet by mouth daily.   nitroGLYCERIN (NITROSTAT) 0.4 MG SL tablet Place 1 tablet (0.4 mg total) under the tongue every 5 (five) minutes as needed for chest  pain.   omeprazole (PRILOSEC) 20 MG capsule Take 20 mg by mouth daily.   simvastatin (ZOCOR) 40 MG tablet Take 40 mg by mouth daily.   tamsulosin (FLOMAX) 0.4 MG CAPS capsule Take 0.4 mg by mouth 2 (two) times daily.      Allergies:   Patient has no known allergies.   Social History   Socioeconomic History   Marital status: Married    Spouse name: Not on file   Number of children: Not on file   Years of education: Not on file   Highest education level: Not on file  Occupational History   Not on file  Tobacco Use   Smoking status: Former    Packs/day: 1.00    Pack years: 0.00    Types: Cigarettes    Quit date: 2000    Years  since quitting: 22.4   Smokeless tobacco: Never  Vaping Use   Vaping Use: Never used  Substance and Sexual Activity   Alcohol use: No   Drug use: No   Sexual activity: Not on file  Other Topics Concern   Not on file  Social History Narrative   Not on file   Social Determinants of Health   Financial Resource Strain: Not on file  Food Insecurity: Not on file  Transportation Needs: Not on file  Physical Activity: Not on file  Stress: Not on file  Social Connections: Not on file     Family History: The patient's family history includes Colon cancer in his mother; Heart disease in his father.  ROS:   Please see the history of present illness.    All other systems reviewed and are negative.  EKGs/Labs/Other Studies Reviewed:    The following studies were reviewed today: EKG reveals sinus rhythm with nonspecific ST-T changes   Recent Labs: No results found for requested labs within last 8760 hours.  Recent Lipid Panel    Component Value Date/Time   CHOL 137 02/26/2019 1553   TRIG 240 (H) 02/26/2019 1553   HDL 50 02/26/2019 1553   CHOLHDL 2.7 02/26/2019 1553   LDLCALC 49 02/26/2019 1553    Physical Exam:    VS:  BP 132/80   Pulse 60   Ht 6' (1.829 m)   Wt 193 lb 3.2 oz (87.6 kg)   SpO2 97%   BMI 26.20 kg/m     Wt Readings  from Last 3 Encounters:  11/07/20 193 lb 3.2 oz (87.6 kg)  02/18/20 196 lb (88.9 kg)  10/26/19 209 lb (94.8 kg)     GEN: Patient is in no acute distress HEENT: Normal NECK: No JVD; No carotid bruits LYMPHATICS: No lymphadenopathy CARDIAC: Hear sounds regular, 2/6 systolic murmur at the apex. RESPIRATORY:  Clear to auscultation without rales, wheezing or rhonchi  ABDOMEN: Soft, non-tender, non-distended MUSCULOSKELETAL:  No edema; No deformity  SKIN: Warm and dry NEUROLOGIC:  Alert and oriented x 3 PSYCHIATRIC:  Normal affect   Signed, Jenean Lindau, MD  11/07/2020 3:29 PM    [facility]

## 2020-11-08 ENCOUNTER — Telehealth (HOSPITAL_COMMUNITY): Payer: Self-pay | Admitting: *Deleted

## 2020-11-08 NOTE — Telephone Encounter (Signed)
Patient's wife given detailed instructions per Myocardial Perfusion Study Information Sheet for the test on 11/09/20 at 11:30. Patient notified to arrive 15 minutes early and that it is imperative to arrive on time for appointment to keep from having the test rescheduled.  If you need to cancel or reschedule your appointment, please call the office within 24 hours of your appointment. . Patient's wife verbalized understanding.[name]

## 2020-11-09 ENCOUNTER — Other Ambulatory Visit: Payer: Self-pay

## 2020-11-09 ENCOUNTER — Other Ambulatory Visit: Payer: Self-pay | Admitting: Cardiology

## 2020-11-09 ENCOUNTER — Ambulatory Visit (INDEPENDENT_AMBULATORY_CARE_PROVIDER_SITE_OTHER): Payer: Medicare HMO

## 2020-11-09 ENCOUNTER — Ambulatory Visit: Payer: Medicare HMO

## 2020-11-09 DIAGNOSIS — Z01818 Encounter for other preprocedural examination: Secondary | ICD-10-CM

## 2020-11-09 DIAGNOSIS — I2510 Atherosclerotic heart disease of native coronary artery without angina pectoris: Secondary | ICD-10-CM | POA: Diagnosis not present

## 2020-11-09 DIAGNOSIS — Z01810 Encounter for preprocedural cardiovascular examination: Secondary | ICD-10-CM | POA: Diagnosis not present

## 2020-11-09 DIAGNOSIS — R002 Palpitations: Secondary | ICD-10-CM | POA: Diagnosis not present

## 2020-11-09 LAB — MYOCARDIAL PERFUSION IMAGING
LV dias vol: 149 mL (ref 62–150)
LV sys vol: 86 mL
Peak HR: 74 {beats}/min
Rest HR: 54 {beats}/min
SDS: 1
SRS: 2
SSS: 3
TID: 1.15

## 2020-11-09 MED ORDER — REGADENOSON 0.4 MG/5ML IV SOLN
0.4000 mg | Freq: Once | INTRAVENOUS | Status: AC
  Administered 2020-11-09: 0.4 mg via INTRAVENOUS

## 2020-11-09 MED ORDER — TECHNETIUM TC 99M TETROFOSMIN IV KIT
31.5000 | PACK | Freq: Once | INTRAVENOUS | Status: AC | PRN
  Administered 2020-11-09: 31.5 via INTRAVENOUS

## 2020-11-09 MED ORDER — TECHNETIUM TC 99M TETROFOSMIN IV KIT
10.9000 | PACK | Freq: Once | INTRAVENOUS | Status: AC | PRN
  Administered 2020-11-09: 10.9 via INTRAVENOUS

## 2020-11-11 ENCOUNTER — Ambulatory Visit (INDEPENDENT_AMBULATORY_CARE_PROVIDER_SITE_OTHER): Payer: Medicare HMO

## 2020-11-11 ENCOUNTER — Other Ambulatory Visit: Payer: Self-pay

## 2020-11-11 ENCOUNTER — Telehealth: Payer: Self-pay | Admitting: Cardiology

## 2020-11-11 DIAGNOSIS — R002 Palpitations: Secondary | ICD-10-CM

## 2020-11-11 DIAGNOSIS — Z01810 Encounter for preprocedural cardiovascular examination: Secondary | ICD-10-CM

## 2020-11-11 DIAGNOSIS — I2510 Atherosclerotic heart disease of native coronary artery without angina pectoris: Secondary | ICD-10-CM

## 2020-11-11 DIAGNOSIS — Z01818 Encounter for other preprocedural examination: Secondary | ICD-10-CM

## 2020-11-11 LAB — ECHOCARDIOGRAM COMPLETE
Area-P 1/2: 3.7 cm2
Calc EF: 42.6 %
MV M vel: 5.56 m/s
MV Peak grad: 123.7 mmHg
P 1/2 time: 645 msec
Radius: 0.7 cm
S' Lateral: 4 cm
Single Plane A2C EF: 41 %
Single Plane A4C EF: 51.2 %

## 2020-11-11 NOTE — Telephone Encounter (Signed)
[name] is calling in to find out why her husband needs to come back in for a appt today at 12:15

## 2020-11-11 NOTE — Progress Notes (Signed)
Complete echocardiogram performed.  Jimmy Ayat Drenning RDCS, RVT  

## 2020-11-11 NOTE — Telephone Encounter (Signed)
Spoke to the patients wife just now. She thought that the patient already had the echo completed on Wednesday. I advised that this was his stress test that was done Wednesday. She Is unhappy at hearing this and asks if it is necessary for them to come today. I advised that it was up to them but that Dr. Geraldo Pitter did recommend that he have the stress test. She verbalizes understanding.

## 2020-11-14 ENCOUNTER — Telehealth: Payer: Self-pay | Admitting: Cardiology

## 2020-11-14 NOTE — Telephone Encounter (Signed)
Pt's wife is calling in regards to pt's Stress and Echo tests pt had last week. Please advise

## 2020-11-15 NOTE — Telephone Encounter (Signed)
Results reviewed with Cora per DPR as per Dr. Julien Nordmann note.  Cora verbalized understanding and had no additional questions. Routed to PCP.

## 2020-11-21 DIAGNOSIS — I2510 Atherosclerotic heart disease of native coronary artery without angina pectoris: Secondary | ICD-10-CM | POA: Diagnosis not present

## 2020-11-21 DIAGNOSIS — R002 Palpitations: Secondary | ICD-10-CM | POA: Diagnosis not present

## 2020-11-27 ENCOUNTER — Other Ambulatory Visit: Payer: Self-pay | Admitting: Cardiology

## 2020-12-09 ENCOUNTER — Other Ambulatory Visit: Payer: Self-pay | Admitting: Cardiology

## 2021-01-18 ENCOUNTER — Other Ambulatory Visit: Payer: Self-pay | Admitting: Cardiology

## 2021-02-20 ENCOUNTER — Other Ambulatory Visit: Payer: Self-pay | Admitting: Cardiology

## 2021-02-21 ENCOUNTER — Telehealth: Payer: Self-pay | Admitting: Cardiology

## 2021-02-21 DIAGNOSIS — I2510 Atherosclerotic heart disease of native coronary artery without angina pectoris: Secondary | ICD-10-CM

## 2021-02-21 MED ORDER — ASPIRIN EC 81 MG PO TBEC: 81.0000 mg | DELAYED_RELEASE_TABLET | Freq: Every day | ORAL | 3 refills | Status: DC

## 2021-02-21 NOTE — Addendum Note (Signed)
Addended by: Truddie Hidden on: 02/21/2021 05:17 PM   Modules accepted: Orders

## 2021-02-21 NOTE — Telephone Encounter (Signed)
Called pt's wife to speak with her regarding the Plavix. She states "when he seen Dr. Geraldo Pitter on 6/20 pt was amenic and Dr. Geraldo Pitter told him to stop taking the Plavix for awhile then start back." I did not see notation of this, will forward to Dr. Geraldo Pitter and his nurse to get advise.

## 2021-02-21 NOTE — Telephone Encounter (Signed)
Pt c/o medication issue:  1. Name of Medication: clopidogrel (PLAVIX) 75 MG tablet [[id number]]  2. How are you currently taking this medication (dosage and times per day)? 1 tablet daily   3. Are you having a reaction (difficulty breathing--STAT)? No   4. What is your medication issue? Mel Almond is calling stating that the pharmacy contacted her and stated they have been trying to get in contact with our office for approval to fill his Plavix. Plavix is not currently listed on the patient's medication list. Please advise.

## 2021-02-21 NOTE — Telephone Encounter (Signed)
Recommendations reviewed with pt's wife as per Dr. Julien Nordmann note.  [name] verbalized understanding and had no additional questions.

## 2021-05-01 ENCOUNTER — Telehealth: Payer: Self-pay | Admitting: Oncology

## 2021-05-01 NOTE — Telephone Encounter (Signed)
Scheduled appt per 12/8 referral. Pt's wife is aware of appt date and time.

## 2021-05-04 DIAGNOSIS — I5189 Other ill-defined heart diseases: Secondary | ICD-10-CM

## 2021-05-04 DIAGNOSIS — I38 Endocarditis, valve unspecified: Secondary | ICD-10-CM

## 2021-05-04 DIAGNOSIS — Z9889 Other specified postprocedural states: Secondary | ICD-10-CM | POA: Insufficient documentation

## 2021-05-04 DIAGNOSIS — K921 Melena: Secondary | ICD-10-CM

## 2021-05-04 DIAGNOSIS — I272 Pulmonary hypertension, unspecified: Secondary | ICD-10-CM

## 2021-05-04 DIAGNOSIS — D62 Acute posthemorrhagic anemia: Secondary | ICD-10-CM

## 2021-05-04 DIAGNOSIS — N400 Enlarged prostate without lower urinary tract symptoms: Secondary | ICD-10-CM

## 2021-05-04 HISTORY — DX: Endocarditis, valve unspecified: I38

## 2021-05-04 HISTORY — DX: Other specified postprocedural states: Z98.890

## 2021-05-04 HISTORY — DX: Benign prostatic hyperplasia without lower urinary tract symptoms: N40.0

## 2021-05-04 HISTORY — DX: Acute posthemorrhagic anemia: D62

## 2021-05-04 HISTORY — DX: Pulmonary hypertension, unspecified: I27.20

## 2021-05-04 HISTORY — DX: Melena: K92.1

## 2021-05-04 HISTORY — DX: Other ill-defined heart diseases: I51.89

## 2021-05-05 DIAGNOSIS — K551 Chronic vascular disorders of intestine: Secondary | ICD-10-CM | POA: Insufficient documentation

## 2021-05-05 HISTORY — DX: Chronic vascular disorders of intestine: K55.1

## 2021-05-11 ENCOUNTER — Telehealth: Payer: Self-pay | Admitting: Oncology

## 2021-05-11 DIAGNOSIS — K922 Gastrointestinal hemorrhage, unspecified: Secondary | ICD-10-CM | POA: Insufficient documentation

## 2021-05-11 HISTORY — DX: Gastrointestinal hemorrhage, unspecified: K92.2

## 2021-05-11 NOTE — Telephone Encounter (Signed)
Patient's spouse called to verify patient's Appt.  May 25, 2021.  Patient is IP at this time

## 2021-05-13 DIAGNOSIS — K5901 Slow transit constipation: Secondary | ICD-10-CM | POA: Insufficient documentation

## 2021-05-13 HISTORY — DX: Slow transit constipation: K59.01

## 2021-05-15 DIAGNOSIS — E43 Unspecified severe protein-calorie malnutrition: Secondary | ICD-10-CM

## 2021-05-15 DIAGNOSIS — R413 Other amnesia: Secondary | ICD-10-CM

## 2021-05-15 HISTORY — DX: Other amnesia: R41.3

## 2021-05-15 HISTORY — DX: Unspecified severe protein-calorie malnutrition: E43

## 2021-05-16 ENCOUNTER — Ambulatory Visit: Payer: Medicare HMO | Admitting: Cardiology

## 2021-05-16 DIAGNOSIS — R162 Hepatomegaly with splenomegaly, not elsewhere classified: Secondary | ICD-10-CM

## 2021-05-16 DIAGNOSIS — R29818 Other symptoms and signs involving the nervous system: Secondary | ICD-10-CM

## 2021-05-16 DIAGNOSIS — I517 Cardiomegaly: Secondary | ICD-10-CM

## 2021-05-16 DIAGNOSIS — K760 Fatty (change of) liver, not elsewhere classified: Secondary | ICD-10-CM | POA: Insufficient documentation

## 2021-05-16 DIAGNOSIS — I3139 Other pericardial effusion (noninflammatory): Secondary | ICD-10-CM

## 2021-05-16 DIAGNOSIS — I739 Peripheral vascular disease, unspecified: Secondary | ICD-10-CM

## 2021-05-16 HISTORY — DX: Other symptoms and signs involving the nervous system: R29.818

## 2021-05-16 HISTORY — DX: Cardiomegaly: I51.7

## 2021-05-16 HISTORY — DX: Hepatomegaly with splenomegaly, not elsewhere classified: R16.2

## 2021-05-16 HISTORY — DX: Other pericardial effusion (noninflammatory): I31.39

## 2021-05-16 HISTORY — DX: Fatty (change of) liver, not elsewhere classified: K76.0

## 2021-05-16 HISTORY — DX: Peripheral vascular disease, unspecified: I73.9

## 2021-05-17 DIAGNOSIS — D500 Iron deficiency anemia secondary to blood loss (chronic): Secondary | ICD-10-CM | POA: Insufficient documentation

## 2021-05-17 HISTORY — DX: Iron deficiency anemia secondary to blood loss (chronic): D50.0

## 2021-05-18 DIAGNOSIS — J986 Disorders of diaphragm: Secondary | ICD-10-CM | POA: Insufficient documentation

## 2021-05-18 DIAGNOSIS — K449 Diaphragmatic hernia without obstruction or gangrene: Secondary | ICD-10-CM | POA: Insufficient documentation

## 2021-05-18 HISTORY — DX: Disorders of diaphragm: J98.6

## 2021-05-24 ENCOUNTER — Other Ambulatory Visit: Payer: Self-pay | Admitting: Oncology

## 2021-05-24 DIAGNOSIS — D759 Disease of blood and blood-forming organs, unspecified: Secondary | ICD-10-CM

## 2021-05-24 NOTE — Progress Notes (Incomplete)
[facility]  [address] [place],  [place]  [address] [phone]  Clinic Day:  05/24/2021  Referring physician: Algis Greenhouse, MD   HISTORY OF PRESENT ILLNESS:  The patient is a 81 y.o. 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  Recent labs showed a low white count of 2.1, as well as a low hemoglobin at 10.9.  His platelet count was normal at 192.  PAST MEDICAL HISTORY:   Past Medical History:  Diagnosis Date   Adenomatous polyp of sigmoid colon 03/18/2019   Formatting of this note might be different from the original. 2020: tubular adenoma   Allergic rhinitis 08/05/2018   Arthritis    Benign essential hypertension 04/05/2016   Bradycardia by electrocardiogram 11/29/2017   CAD (coronary artery disease), native coronary artery 04/05/2016   Overview:  Formatting of this note may be different from the original.       11/2012:  DES of LAD, D2 and RCA     Cancer ([facility])    Cardiac murmur 11/07/2020   Cellulitis 10/20/2020   Formatting of this note might be different from the original. 10/20/2020   COVID-19 virus infection 02/08/2020   Formatting of this note might be different from the original. 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;  Service: Orthopedics;  Laterality: Left;    CURRENT MEDICATIONS:   Current Outpatient Medications  Medication Sig Dispense Refill   Ascorbic Acid (VITAMIN C) 1000 MG tablet Take 1,000 mg by mouth daily.     aspirin EC 81 MG tablet Take 1 tablet (81 mg total) by mouth daily. Swallow whole. 90 tablet 3   carvedilol (COREG) 6.25 MG tablet Take 1 tablet (6.25 mg total) by mouth 2 (two) times daily. 180 tablet 3   D-Mannose 500 MG CAPS Take 500 mg by mouth daily.  dutasteride (AVODART) 0.5 MG capsule Take 1 capsule by mouth daily.     gabapentin (NEURONTIN) 100 MG capsule Take 100 mg by mouth as needed (pain).      hydrochlorothiazide (MICROZIDE) 12.5 MG capsule TAKE 1 CAPSULE(12.5 MG) BY MOUTH DAILY 90 capsule 3   Multiple Vitamin (MULTIVITAMIN WITH MINERALS) TABS tablet Take 1 tablet by mouth daily.     nitroGLYCERIN (NITROSTAT) 0.4 MG SL tablet Place 1 tablet (0.4 mg total) under the tongue every 5 (five) minutes as needed for chest pain. 30 tablet 3   omeprazole (PRILOSEC) 20 MG capsule Take 20 mg by mouth daily.     simvastatin (ZOCOR) 40 MG tablet Take 40  mg by mouth daily.     tamsulosin (FLOMAX) 0.4 MG CAPS capsule Take 0.4 mg by mouth 2 (two) times daily.      No current facility-administered medications for this visit.    ALLERGIES:  No Known Allergies  FAMILY HISTORY:   Family History  Problem Relation Age of Onset   Colon cancer Mother    Heart disease Father     SOCIAL HISTORY:   reports that he quit smoking about 23 years ago. His smoking use included cigarettes. He smoked an average of 1 pack per day. He has never used smokeless tobacco. He reports that he does not drink alcohol and does not use drugs.  REVIEW OF SYSTEMS:  Review of Systems - Oncology   PHYSICAL EXAM:  There were no vitals taken for this visit. Wt Readings from Last 3 Encounters:  11/09/20 193 lb (87.5 kg)  11/07/20 193 lb 3.2 oz (87.6 kg)  02/18/20 196 lb (88.9 kg)   There is no height or weight on file to calculate BMI. Performance status (ECOG): {CHL ONC [id number] Physical Exam  LABS:   CBC Latest Ref Rng & Units 10/26/2019 07/23/2015 07/22/2015  WBC 3.4 - 10.8 x10E3/uL 6.2 7.4 8.5  Hemoglobin 13.0 - 17.7 g/dL 13.1 9.2(L) 11.3(L)  Hematocrit 37.5 - 51.0 % 38.6 27.5(L) 33.0(L)  Platelets 150 - 450 x10E3/uL 225 143(L) 177   CMP Latest Ref Rng & Units 10/26/2019 07/01/2019 05/01/2019  Glucose 65 - 99 mg/dL 97 95 71  BUN 8 - 27 mg/dL 16 15 17   Creatinine 0.76 - 1.27 mg/dL 1.08 0.96 1.06  Sodium 134 - 144 mmol/L 143 141 140  Potassium 3.5 - 5.2 mmol/L 3.9 4.1 4.2  Chloride 96 - 106 mmol/L 104 97 100  CO2 20 - 29 mmol/L 28 29 27   Calcium 8.6 - 10.2 mg/dL 8.8 9.0 9.4  Total Protein 6.0 - 8.5 g/dL - - -  Total Bilirubin 0.0 - 1.2 mg/dL - - -  Alkaline Phos 39 - 117 IU/L - - -  AST 0 - 40 IU/L - - -  ALT 0 - 44 IU/L - - -     No results found for: CEA1 / No results found for: CEA1 No results found for: PSA1 No results found for: QHU765 No results found for: YYT035  No results found for: TOTALPROTELP, ALBUMINELP, A1GS, A2GS, BETS,  BETA2SER, GAMS, MSPIKE, SPEI No results found for: TIBC, FERRITIN, IRONPCTSAT No results found for: LDH  No results found for: AFPTUMOR, TOTALPROTELP, ALBUMINELP, A1GS, A2GS, BETS, BETA2SER, GAMS, MSPIKE, SPEI, LDH, CEA1, PSA1, IGASERUM, IGGSERUM, IGMSERUM, THGAB, THYROGLB  Recent Review Flowsheet Data   There is no flowsheet data to display.     STUDIES:  No results found.   ASSESSMENT & PLAN:  A 81 y.o. male who  I was asked to consult upon for *** .The patient understands all the plans discussed today and is in agreement with them.  I do appreciate Dough, Jaymes Graff, MD for his new consult.   Treyvin Glidden Macarthur Critchley, MD

## 2021-05-25 ENCOUNTER — Encounter: Payer: Medicare HMO | Admitting: Oncology

## 2021-05-25 ENCOUNTER — Other Ambulatory Visit: Payer: Medicare HMO

## 2021-07-05 ENCOUNTER — Inpatient Hospital Stay: Payer: Medicare HMO | Attending: Oncology | Admitting: Oncology

## 2021-07-05 ENCOUNTER — Other Ambulatory Visit: Payer: Self-pay

## 2021-07-05 ENCOUNTER — Inpatient Hospital Stay: Payer: Medicare HMO

## 2021-07-05 ENCOUNTER — Encounter: Payer: Self-pay | Admitting: Oncology

## 2021-07-05 ENCOUNTER — Other Ambulatory Visit: Payer: Self-pay | Admitting: Oncology

## 2021-07-05 ENCOUNTER — Other Ambulatory Visit: Payer: Self-pay | Admitting: Hematology and Oncology

## 2021-07-05 DIAGNOSIS — D759 Disease of blood and blood-forming organs, unspecified: Secondary | ICD-10-CM

## 2021-07-05 DIAGNOSIS — D649 Anemia, unspecified: Secondary | ICD-10-CM | POA: Diagnosis not present

## 2021-07-05 DIAGNOSIS — D72819 Decreased white blood cell count, unspecified: Secondary | ICD-10-CM | POA: Diagnosis not present

## 2021-07-05 DIAGNOSIS — Z79899 Other long term (current) drug therapy: Secondary | ICD-10-CM | POA: Diagnosis not present

## 2021-07-05 DIAGNOSIS — Z800 Family history of malignant neoplasm of digestive organs: Secondary | ICD-10-CM | POA: Diagnosis not present

## 2021-07-05 DIAGNOSIS — Z87891 Personal history of nicotine dependence: Secondary | ICD-10-CM | POA: Insufficient documentation

## 2021-07-05 HISTORY — DX: Disease of blood and blood-forming organs, unspecified: D75.9

## 2021-07-05 LAB — BASIC METABOLIC PANEL
BUN: 15 (ref 4–21)
CO2: 29 — AB (ref 13–22)
Chloride: 105 (ref 99–108)
Creatinine: 0.9 (ref 0.6–1.3)
Glucose: 85
Potassium: 4 (ref 3.4–5.3)
Sodium: 137 (ref 137–147)

## 2021-07-05 LAB — TSH: TSH: 1.087 u[IU]/mL (ref 0.350–4.500)

## 2021-07-05 LAB — CBC AND DIFFERENTIAL
HCT: 34 — AB (ref 41–53)
Hemoglobin: 11.1 — AB (ref 13.5–17.5)
Neutrophils Absolute: 0.85
Platelets: 198 (ref 150–399)
WBC: 1.7

## 2021-07-05 LAB — IRON AND TIBC
Iron: 49 ug/dL (ref 45–182)
Saturation Ratios: 21 % (ref 17.9–39.5)
TIBC: 234 ug/dL — ABNORMAL LOW (ref 250–450)
UIBC: 185 ug/dL

## 2021-07-05 LAB — CBC: RBC: 3.79 — AB (ref 3.87–5.11)

## 2021-07-05 LAB — FOLATE: Folate: 8.8 ng/mL (ref >5.9)

## 2021-07-05 LAB — COMPREHENSIVE METABOLIC PANEL
Albumin: 2.7 — AB (ref 3.5–5.0)
Calcium: 8.8 (ref 8.7–10.7)

## 2021-07-05 LAB — HEPATIC FUNCTION PANEL
ALT: 14 (ref 10–40)
AST: 41 — AB (ref 14–40)
Alkaline Phosphatase: 84 (ref 25–125)
Bilirubin, Total: 0.6

## 2021-07-05 LAB — FERRITIN: Ferritin: 290 ng/mL (ref 24–336)

## 2021-07-05 LAB — VITAMIN B12: Vitamin B-12: 446 pg/mL (ref 180–914)

## 2021-07-05 NOTE — Progress Notes (Signed)
[facility]  [address] [place],  [place]  [address] [phone]  Clinic Day:  07/05/2021  Referring physician: Algis Greenhouse, MD   HISTORY OF PRESENT ILLNESS:  The patient is a 81 y.o. 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  Labs in December 2022 showed a low white count of 2.0 and a low hemoglobin of 8.4.  His platelet count was normal at 207.  According to his wife, the patient was hospitalized in December 2022/January 2023 for GI blood loss.  During that time, he required 6 units of packed red blood cells.  The patient recalls having copious amounts of GI blood loss.  However, an extensive GI work-up, which included an EGD, colonoscopy, capsule endoscopy, and CT scans, failed to show any etiology behind his blood loss.  However, his wife claims Hemoccult testing done within the past week still showed signs of GI blood loss being present.  Since being hospitalized in December 2022, the patient has been taking 2 iron pills daily.  He denies noticing any overt forms of blood loss over these past few months.  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  He denies being placed on any new medications.  He has been taking hydralazine over the past year, which does have the potential to cause mild cytopenias.  He denies having any B symptoms which are concerning for his cytopenias potentially being due to an aggressive hematologic malignancy.  Although he still has a mild degree of fatigue, he does feel better than what he did 2 months ago.  PAST MEDICAL HISTORY:   Past Medical History:  Diagnosis Date   Adenomatous polyp of sigmoid colon 03/18/2019   Formatting of this note might be different from the original. 2020: tubular adenoma   Allergic rhinitis 08/05/2018   Arthritis    Benign essential hypertension 04/05/2016   Bradycardia by electrocardiogram 11/29/2017   CAD  (coronary artery disease), native coronary artery 04/05/2016   Overview:  Formatting of this note may be different from the original.       11/2012:  DES of LAD, D2 and RCA     Cancer ([facility])    Cardiac murmur 11/07/2020   Cellulitis 10/20/2020   Formatting of this note might be different from the original. 10/20/2020   COVID-19 virus infection 02/08/2020   Formatting of this note might be different from the original. 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   Suspected COVID-19 virus infection 04/23/2019    PAST SURGICAL HISTORY:   Past Surgical History:  Procedure Laterality Date   CARDIAC CATHETERIZATION     CHOLECYSTECTOMY     2002 [facility]     04/2012 [facility]   CORONARY ANGIOPLASTY     HIGH PT REGIONAL  2014   HIP ARTHROPLASTY     RIGHT HIP  2012   TOTAL HIP ARTHROPLASTY Left 07/21/2015   Procedure: LEFT TOTAL HIP ARTHROPLASTY ANTERIOR APPROACH;  Surgeon:  Mcarthur Rossetti, MD;  Location: [facility];  Service: Orthopedics;  Laterality: Left;    CURRENT MEDICATIONS:   Current Outpatient Medications  Medication Sig Dispense Refill   Ascorbic Acid (VITAMIN C) 1000 MG tablet Take 1,000 mg by mouth daily.     aspirin EC 81 MG tablet Take 1 tablet (81 mg total) by mouth daily. Swallow whole. 90 tablet 3   carvedilol (COREG) 6.25 MG tablet Take 1 tablet (6.25 mg total) by mouth 2 (two) times daily. 180 tablet 3   D-Mannose 500 MG CAPS Take 500 mg by mouth daily.     dutasteride (AVODART) 0.5 MG capsule Take 1 capsule by mouth daily.     gabapentin (NEURONTIN) 100 MG capsule Take 100 mg by mouth as needed (pain).      hydrochlorothiazide (MICROZIDE) 12.5 MG capsule TAKE 1 CAPSULE(12.5 MG) BY MOUTH DAILY 90 capsule 3   Multiple Vitamin (MULTIVITAMIN WITH MINERALS) TABS tablet Take 1 tablet by mouth daily.     nitroGLYCERIN (NITROSTAT) 0.4 MG SL tablet Place 1 tablet (0.4 mg total) under the tongue every 5 (five) minutes as needed for chest pain. 30 tablet 3   omeprazole (PRILOSEC) 20 MG capsule Take 20 mg by mouth daily.     simvastatin (ZOCOR) 40 MG tablet Take 40 mg by mouth daily.     tamsulosin (FLOMAX) 0.4 MG CAPS capsule Take 0.4 mg by mouth 2 (two) times daily.      No current facility-administered medications for this visit.    ALLERGIES:  No Known Allergies  FAMILY HISTORY:   Family History  Problem Relation Age of Onset   Colon cancer Mother    Heart disease Father   One of his 3 sisters died from lung cancer.  He has 2 brothers both have diabetes.  SOCIAL HISTORY:  The patient was born in [place], [place].  He currently lives in [place] with his wife of 34 years.  They have 4 children, 6 grandchildren, and 7 great-grandchildren.  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  He has not smoked or used alcohol in over 50+ years.  REVIEW OF SYSTEMS:  Review of Systems  Constitutional:  Positive for fatigue.  Negative for fever and unexpected weight change.  Respiratory:  Positive for shortness of breath. Negative for chest tightness, cough and hemoptysis.   Cardiovascular:  Positive for palpitations. Negative for chest pain.  Gastrointestinal:  Positive for blood in stool. Negative for abdominal distention, abdominal pain, constipation, diarrhea, nausea and vomiting.  Genitourinary:  Negative for dysuria, frequency and hematuria.   Musculoskeletal:  Positive for arthralgias. Negative for back pain and myalgias.  Skin:  Negative for itching and rash.  Neurological:  Negative for dizziness, headaches and light-headedness.  Psychiatric/Behavioral:  Negative for depression and suicidal ideas. The patient is not nervous/anxious.     PHYSICAL EXAM:  Vital signs include a weight of 180 pounds,  temperature 98.6 degrees, pulse 62, respiratory rate 18, blood pressure 136/65 There were no vitals taken for this visit. Wt Readings from Last 3 Encounters:  11/09/20 193 lb (87.5 kg)  11/07/20 193 lb 3.2 oz (87.6 kg)  02/18/20 196 lb (88.9 kg)   There is no height or weight on file to calculate BMI. Performance status (ECOG): 1 - Symptomatic but completely ambulatory Physical Exam Constitutional:      Appearance: Normal appearance. He is not ill-appearing.  HENT:     Mouth/Throat:     Mouth: Mucous membranes are moist.     Pharynx: Oropharynx is clear. No oropharyngeal exudate or posterior oropharyngeal erythema.  Cardiovascular:     Rate and Rhythm: Normal rate and regular rhythm.     Heart sounds: No murmur heard.   No friction rub. No gallop.  Pulmonary:     Effort: Pulmonary effort is normal. No respiratory distress.     Breath sounds: Normal breath sounds. No wheezing, rhonchi or rales.  Abdominal:     General: Bowel sounds are normal. There is no distension.     Palpations: Abdomen is soft. There is no mass.     Tenderness: There is no abdominal tenderness.  Musculoskeletal:        General:  No swelling.     Right lower leg: No edema.     Left lower leg: No edema.  Lymphadenopathy:     Cervical: No cervical adenopathy.     Upper Body:     Right upper body: No supraclavicular or axillary adenopathy.     Left upper body: No supraclavicular or axillary adenopathy.     Lower Body: No right inguinal adenopathy. No left inguinal adenopathy.  Skin:    General: Skin is warm.     Coloration: Skin is not jaundiced.     Findings: No lesion or rash.  Neurological:     General: No focal deficit present.     Mental Status: He is alert and oriented to person, place, and time. Mental status is at baseline.  Psychiatric:        Mood and Affect: Mood normal.        Behavior: Behavior normal.        Thought Content: Thought content normal.    LABS:    CMP Latest Ref Rng & Units 07/05/2021 10/26/2019 07/01/2019  Glucose 65 - 99 mg/dL - 97 95  BUN 4 - _0 Creatinine 0.6 - 1.3 0.9 1.08 0.96  Sodium 137 - 147 137 143 141  Potassium 3.4 - 5.3 4.0 3.9 4.1  Chloride 99 - 108 105 104 97  CO2 13 - 22 29(A) 28 29  Calcium 8.7 - 10.7 8.8 8.8 9.0  Total Protein 6.0 - 8.5 g/dL - - -  Total Bilirubin 0.0 - 1.2 mg/dL - - -  Alkaline Phos 25 - 125 84 - -  AST 14 - 40 41(A) - -  ALT 10 - 40 14 - -   ASSESSMENT & PLAN:  A 81 y.o. male who I was asked to consult upon for cytopenias, primarily leukopenia and anemia.  In clinic today, her iron, B12, and folate levels will be checked to ensure there are no nutritional deficiencies factoring into her low counts.  As mentioned previously, this gentleman has been taking iron pills over the past 2 months due to GI blood loss he recently had.  I will also check a serum protein electrophoresis to ensure an underlying plasma  cell dyscrasia is not factoring into his cytopenias.  His TSH level will be checked to ensure severe thyroid disease is not factoring into his low counts.  When evaluating his medication list, the only medicine that I am concerned  about is hydralazine, which is known to cause cytopenias.  However, I would not stop this medication until all other etiologies behind the cytopenias have been ruled out.  I will see this patient back in 2 weeks to go over all of his labs collected today, as well as their implications.  If no plausible etiology is found and his peripheral counts remain low, he ultimately may need a bone marrow biopsy for further evaluation.  The patient understands all the plans discussed today and is in agreement with them.  I do appreciate Dough, Jaymes Graff, MD for his new consult.   Angeni Chaudhuri Macarthur Critchley, MD

## 2021-07-05 NOTE — Addendum Note (Signed)
Addended byGeorgette Shell on: 07/05/2021 04:34 PM   Modules accepted: Orders

## 2021-07-07 LAB — PROTEIN ELECTROPHORESIS, SERUM
A/G Ratio: 0.7 (ref 0.7–1.7)
Albumin ELP: 2.7 g/dL — ABNORMAL LOW (ref 2.9–4.4)
Alpha-1-Globulin: 0.3 g/dL (ref 0.0–0.4)
Alpha-2-Globulin: 0.4 g/dL (ref 0.4–1.0)
Beta Globulin: 0.9 g/dL (ref 0.7–1.3)
Gamma Globulin: 2.2 g/dL — ABNORMAL HIGH (ref 0.4–1.8)
Globulin, Total: 3.8 g/dL (ref 2.2–3.9)
Total Protein ELP: 6.5 g/dL (ref 6.0–8.5)

## 2021-07-13 NOTE — Progress Notes (Signed)
?[facility]  ?[address] ?[place],  [place]  [address] ?[phone] ? ?Clinic Day:  07/19/2021 ? ?Referring physician: Algis Greenhouse, MD ? ?This document serves as a record of services personally performed by Marice Potter, MD. It was created on their behalf by Curry,Lauren E, a trained medical scribe. The creation of this record is based on the scribe's personal observations and the provider's statements to them. ? ?HISTORY OF PRESENT ILLNESS:  ?The patient is a 81 y.o. male  who I recently began seeing for pancytopenia.  He comes in today to go over all of his recent labs to determine the etiology behind this.  Since his last visit, the patient claims to be feeling stronger.  He denies having any new symptoms or findings which concern him for declining peripheral counts.    ? ?PHYSICAL EXAM:  ?Vital signs include a weight of 180 pounds, temperature 98.6 degrees, pulse 62, respiratory rate 18, blood pressure 136/65 ?Blood pressure (!) 156/75, pulse (!) 57, temperature (!) 97.5 ?F (36.4 ?C), resp. rate 16, height 6' (1.829 m), weight 180 lb 14.4 oz (82.1 kg), SpO2 99 %. ?Wt Readings from Last 3 Encounters:  ?07/19/21 180 lb 14.4 oz (82.1 kg)  ?07/05/21 180 lb (81.6 kg)  ?11/09/20 193 lb (87.5 kg)  ? ?Body mass index is 24.53 kg/m?Marland Kitchen ?Performance status (ECOG): 1 - Symptomatic but completely ambulatory ?Physical Exam ?Constitutional:   ?   Appearance: Normal appearance. He is not ill-appearing.  ?HENT:  ?   Mouth/Throat:  ?   Mouth: Mucous membranes are moist.  ?   Pharynx: Oropharynx is clear. No oropharyngeal exudate or posterior oropharyngeal erythema.  ?Cardiovascular:  ?   Rate and Rhythm: Normal rate and regular rhythm.  ?   Heart sounds: No murmur heard. ?  No friction rub. No gallop.  ?Pulmonary:  ?   Effort: Pulmonary effort is normal. No respiratory distress.  ?   Breath sounds: Normal breath sounds. No wheezing, rhonchi or rales.  ?Abdominal:  ?   General: Bowel  sounds are normal. There is no distension.  ?   Palpations: Abdomen is soft. There is no mass.  ?   Tenderness: There is no abdominal tenderness.  ?Musculoskeletal:     ?   General: No swelling.  ?   Right lower leg: No edema.  ?   Left lower leg: No edema.  ?Lymphadenopathy:  ?   Cervical: No cervical adenopathy.  ?   Upper Body:  ?   Right upper body: No supraclavicular or axillary adenopathy.  ?   Left upper body: No supraclavicular or axillary adenopathy.  ?   Lower Body: No right inguinal adenopathy. No left inguinal adenopathy.  ?Skin: ?   General: Skin is warm.  ?   Coloration: Skin is not jaundiced.  ?   Findings: No lesion or rash.  ?Neurological:  ?   General: No focal deficit present.  ?   Mental Status: He is alert and oriented to person, place, and time. Mental status is at baseline.  ?Psychiatric:     ?   Mood and Affect: Mood normal.     ?   Behavior: Behavior normal.     ?   Thought Content: Thought content normal.  ? ? ?LABS:  ? ? ?CMP Latest Ref Rng & Units 07/05/2021 10/26/2019 07/01/2019  ?Glucose 65 - 99 mg/dL - 97 95  ?BUN 4 - '21 15 16 15  ' ?Creatinine 0.6 -  1.3 0.9 1.08 0.96  ?Sodium 137 - 147 137 143 141  ?Potassium 3.4 - 5.3 4.0 3.9 4.1  ?Chloride 99 - 108 105 104 97  ?CO2 13 - 22 29(A) 28 29  ?Calcium 8.7 - 10.7 8.8 8.8 9.0  ?Total Protein 6.0 - 8.5 g/dL - - -  ?Total Bilirubin 0.0 - 1.2 mg/dL - - -  ?Alkaline Phos 25 - 125 84 - -  ?AST 14 - 40 41(A) - -  ?ALT 10 - 40 14 - -  ? ? Latest Reference Range & Units 07/05/21 11:11 07/05/21 14:29  ?Iron 45 - 182 ug/dL  49  ?UIBC ug/dL  185  ?TIBC 250 - 450 ug/dL  234 (L)  ?Saturation Ratios 17.9 - 39.5 %  21  ?Ferritin 24 - 336 ng/mL 290   ?Folate >5.9 ng/mL 8.8   ?Vitamin B12 180 - 914 pg/mL 446   ?Total Protein ELP 6.0 - 8.5 g/dL 6.5   ?Albumin ELP 2.9 - 4.4 g/dL 2.7 (L)   ?Globulin, Total 2.2 - 3.9 g/dL 3.8 (C)   ?A/G Ratio 0.7 - 1.7  0.7 (C)   ?Alpha-1-Globulin 0.0 - 0.4 g/dL 0.3   ?Alpha-2-Globulin 0.4 - 1.0 g/dL 0.4   ?Beta Globulin 0.7 - 1.3  g/dL 0.9   ?Gamma Globulin 0.4 - 1.8 g/dL 2.2 (H)   ?M-SPIKE, % Not Observed g/dL Not Observed   ?SPE Interp.  Comment   ?Comment  Comment   ?(L): Data is abnormally low ?(H): Data is abnormally high ?(C): Corrected ? Latest Reference Range & Units 07/05/21 11:11  ?TSH 0.350 - 4.500 uIU/mL 1.087  ? ?ASSESSMENT & PLAN:  ?A 81 y.o. male who I was asked to consult upon for cytopenias, primarily leukopenia and anemia.  I am pleased as his CBC today clearly shows an improvement in both his white count and hemoglobin.  Although not normal, these levels are better.  Clinically, the patient is doing better.  I am also pleased as his recent labs showed no evidence of any nutritional deficiencies, thyroid disease, or multiple myeloma potentially factoring into his cytopenias.  As his numbers are better and he is clinically doing fine, his cytopenias will be followed conservatively.  I will see him back in 3 months for repeat clinical assessment.  The patient understands all the plans discussed today and is in agreement with them. ? ?I, Rita Ohara, am acting as scribe for Marice Potter, MD   ? ?I have reviewed this report as typed by the medical scribe, and it is complete and accurate. ? ?Sophea Rackham Macarthur Critchley, MD ? ? ? ?  ?

## 2021-07-19 ENCOUNTER — Inpatient Hospital Stay: Payer: Medicare HMO

## 2021-07-19 ENCOUNTER — Inpatient Hospital Stay: Payer: Medicare HMO | Attending: Oncology | Admitting: Oncology

## 2021-07-19 ENCOUNTER — Other Ambulatory Visit: Payer: Self-pay | Admitting: Oncology

## 2021-07-19 ENCOUNTER — Encounter: Payer: Self-pay | Admitting: Oncology

## 2021-07-19 ENCOUNTER — Other Ambulatory Visit: Payer: Self-pay

## 2021-07-19 VITALS — BP 156/75 | HR 57 | Temp 97.5°F | Resp 16 | Ht 72.0 in | Wt 180.9 lb

## 2021-07-19 DIAGNOSIS — D649 Anemia, unspecified: Secondary | ICD-10-CM | POA: Insufficient documentation

## 2021-07-19 DIAGNOSIS — D759 Disease of blood and blood-forming organs, unspecified: Secondary | ICD-10-CM | POA: Insufficient documentation

## 2021-07-19 DIAGNOSIS — D72819 Decreased white blood cell count, unspecified: Secondary | ICD-10-CM | POA: Insufficient documentation

## 2021-07-19 LAB — CBC AND DIFFERENTIAL
HCT: 38 — AB (ref 41–53)
Hemoglobin: 12.5 — AB (ref 13.5–17.5)
Neutrophils Absolute: 1.24
Platelets: 176 (ref 150–399)
WBC: 2.3

## 2021-07-19 LAB — CBC: RBC: 4.2 (ref 3.87–5.11)

## 2021-07-19 LAB — TSH: TSH: 0.815 u[IU]/mL (ref 0.350–4.500)

## 2021-10-19 ENCOUNTER — Other Ambulatory Visit: Payer: Self-pay | Admitting: Oncology

## 2021-10-19 ENCOUNTER — Inpatient Hospital Stay: Payer: Medicare HMO | Attending: Oncology | Admitting: Oncology

## 2021-10-19 ENCOUNTER — Inpatient Hospital Stay: Payer: Medicare HMO

## 2021-10-19 ENCOUNTER — Telehealth: Payer: Self-pay | Admitting: Oncology

## 2021-10-19 VITALS — BP 158/77 | HR 54 | Temp 97.8°F | Resp 16 | Ht 72.0 in | Wt 190.3 lb

## 2021-10-19 DIAGNOSIS — D759 Disease of blood and blood-forming organs, unspecified: Secondary | ICD-10-CM | POA: Diagnosis not present

## 2021-10-19 DIAGNOSIS — D72819 Decreased white blood cell count, unspecified: Secondary | ICD-10-CM | POA: Insufficient documentation

## 2021-10-19 DIAGNOSIS — D649 Anemia, unspecified: Secondary | ICD-10-CM | POA: Diagnosis present

## 2021-10-19 LAB — TSH: TSH: 1.054 u[IU]/mL (ref 0.350–4.500)

## 2021-10-19 LAB — CBC AND DIFFERENTIAL
HCT: 40 — AB (ref 41–53)
Hemoglobin: 13.1 — AB (ref 13.5–17.5)
Neutrophils Absolute: 1.76
Platelets: 178 10*3/uL (ref 150–400)
WBC: 3.2

## 2021-10-19 LAB — CBC: RBC: 4.39 (ref 3.87–5.11)

## 2021-10-19 NOTE — Progress Notes (Signed)
[facility]  [address] [place],  [place]  [address] [phone]  Clinic Day:  10/19/2021  Referring physician: Algis Greenhouse, MD  HISTORY OF PRESENT ILLNESS:  The patient is a 81 y.o. male  who I recently began seeing for pancytopenia.  A recent work-up did not reveal any obvious etiology behind his low blood counts.  As his peripheral counts were not particularly low, they have been followed conservatively.he comes back in today to reassess his counts.  Since his last visit, the patient has been doing well.  He denies having any new symptoms or findings which concern him for declining peripheral counts.     PHYSICAL EXAM:  Vital signs include a weight of 180 pounds, temperature 98.6 degrees, pulse 62, respiratory rate 18, blood pressure 136/65 There were no vitals taken for this visit. Wt Readings from Last 3 Encounters:  07/19/21 180 lb 14.4 oz (82.1 kg)  07/05/21 180 lb (81.6 kg)  11/09/20 193 lb (87.5 kg)   There is no height or weight on file to calculate BMI. Performance status (ECOG): 1 - Symptomatic but completely ambulatory Physical Exam Constitutional:      Appearance: Normal appearance. He is not ill-appearing.  HENT:     Mouth/Throat:     Mouth: Mucous membranes are moist.     Pharynx: Oropharynx is clear. No oropharyngeal exudate or posterior oropharyngeal erythema.  Cardiovascular:     Rate and Rhythm: Normal rate and regular rhythm.     Heart sounds: No murmur heard.   No friction rub. No gallop.  Pulmonary:     Effort: Pulmonary effort is normal. No respiratory distress.     Breath sounds: Normal breath sounds. No wheezing, rhonchi or rales.  Abdominal:     General: Bowel sounds are normal. There is no distension.     Palpations: Abdomen is soft. There is no mass.     Tenderness: There is no abdominal tenderness.  Musculoskeletal:        General: No swelling.     Right lower leg: No edema.     Left lower leg: No edema.   Lymphadenopathy:     Cervical: No cervical adenopathy.     Upper Body:     Right upper body: No supraclavicular or axillary adenopathy.     Left upper body: No supraclavicular or axillary adenopathy.     Lower Body: No right inguinal adenopathy. No left inguinal adenopathy.  Skin:    General: Skin is warm.     Coloration: Skin is not jaundiced.     Findings: No lesion or rash.  Neurological:     General: No focal deficit present.     Mental Status: He is alert and oriented to person, place, and time. Mental status is at baseline.  Psychiatric:        Mood and Affect: Mood normal.        Behavior: Behavior normal.        Thought Content: Thought content normal.    LABS:    ASSESSMENT & PLAN:  A 81 y.o. male who I recently had seeing for mild pancytopenia, with primarily his white cells and red cells being affected.  I am very pleased with his peripheral counts today as all 3 cell lines are much better than what they were at his initial visit.  Based upon this and the fact that he is clinically doing well, he will continue to be followed conservatively from a hematologic perspective.  I will see him back in 6 months for repeat clinical assessment.  The patient understands all the plans discussed today and is in agreement with them.   Maelie Chriswell Macarthur Critchley, MD

## 2021-10-19 NOTE — Telephone Encounter (Signed)
Per 10/19/21 los-Spoke with wife and next appt scheduled and confirmed

## 2021-12-04 ENCOUNTER — Other Ambulatory Visit: Payer: Self-pay | Admitting: Cardiology

## 2022-01-26 ENCOUNTER — Other Ambulatory Visit: Payer: Self-pay | Admitting: Cardiology

## 2022-02-14 ENCOUNTER — Other Ambulatory Visit: Payer: Self-pay | Admitting: Cardiology

## 2022-04-07 ENCOUNTER — Other Ambulatory Visit: Payer: Self-pay | Admitting: Cardiology

## 2022-04-09 NOTE — Telephone Encounter (Signed)
Refill x 30 tablets only , needs appointment 2nd attempt

## 2022-04-16 ENCOUNTER — Other Ambulatory Visit: Payer: Self-pay | Admitting: *Deleted

## 2022-04-16 MED ORDER — SIMVASTATIN 40 MG PO TABS: 40.0000 mg | ORAL_TABLET | Freq: Every day | ORAL | 0 refills | Status: DC

## 2022-04-16 MED ORDER — HYDROCHLOROTHIAZIDE 12.5 MG PO CAPS: 12.5000 mg | ORAL_CAPSULE | Freq: Every day | ORAL | 0 refills | Status: DC

## 2022-04-16 MED ORDER — CARVEDILOL 6.25 MG PO TABS: ORAL_TABLET | ORAL | 0 refills | Status: DC

## 2022-04-16 NOTE — Telephone Encounter (Signed)
Rx refill sent to pharmacy. 

## 2022-04-16 NOTE — Telephone Encounter (Signed)
Received a voicemail in the refill room from pt's wife requesting refills for Simvastatin, HCTZ, & Carvedilol.  They are requesting a call back.

## 2022-04-20 ENCOUNTER — Inpatient Hospital Stay: Payer: Medicare HMO | Attending: Oncology | Admitting: Oncology

## 2022-04-20 ENCOUNTER — Other Ambulatory Visit: Payer: Self-pay | Admitting: Oncology

## 2022-04-20 ENCOUNTER — Telehealth: Payer: Self-pay | Admitting: Oncology

## 2022-04-20 ENCOUNTER — Inpatient Hospital Stay: Payer: Medicare HMO

## 2022-04-20 VITALS — BP 181/81 | HR 53 | Temp 98.1°F | Resp 16 | Ht 72.0 in | Wt 197.8 lb

## 2022-04-20 DIAGNOSIS — D759 Disease of blood and blood-forming organs, unspecified: Secondary | ICD-10-CM | POA: Diagnosis not present

## 2022-04-20 DIAGNOSIS — Z79899 Other long term (current) drug therapy: Secondary | ICD-10-CM | POA: Diagnosis not present

## 2022-04-20 DIAGNOSIS — D61818 Other pancytopenia: Secondary | ICD-10-CM | POA: Insufficient documentation

## 2022-04-20 LAB — CBC AND DIFFERENTIAL
HCT: 39 — AB (ref 41–53)
Hemoglobin: 13.2 — AB (ref 13.5–17.5)
Neutrophils Absolute: 5.33
Platelets: 212 10*3/uL (ref 150–400)
WBC: 7.5

## 2022-04-20 LAB — HEPATIC FUNCTION PANEL
ALT: 13 U/L (ref 10–40)
AST: 22 (ref 14–40)
Alkaline Phosphatase: 68 (ref 25–125)
Bilirubin, Total: 0.8

## 2022-04-20 LAB — BASIC METABOLIC PANEL
BUN: 14 (ref 4–21)
CO2: 30 — AB (ref 13–22)
Chloride: 104 (ref 99–108)
Creatinine: 0.8 (ref 0.6–1.3)
Glucose: 83
Potassium: 4.5 mEq/L (ref 3.5–5.1)
Sodium: 138 (ref 137–147)

## 2022-04-20 LAB — CBC: RBC: 4.1 (ref 3.87–5.11)

## 2022-04-20 LAB — COMPREHENSIVE METABOLIC PANEL
Albumin: 3.6 (ref 3.5–5.0)
Calcium: 8.6 — AB (ref 8.7–10.7)

## 2022-04-20 LAB — TSH: TSH: 1.07 u[IU]/mL (ref 0.350–4.500)

## 2022-04-20 NOTE — Progress Notes (Signed)
[facility]  [address] [place],  [place]  [address] [phone]  Clinic Day:  04/20/2022  Referring physician: Algis Greenhouse, MD  HISTORY OF PRESENT ILLNESS:  The patient is a 81 y.o. male  who I recently began seeing for pancytopenia.  A recent work-up did not reveal any obvious etiology behind his low blood counts.  As his peripheral counts have not been particularly low, they have been followed conservatively.  He comes back in today to reassess his counts.  Since his last visit, the patient has been doing well.  He denies having any new symptoms or findings which concern him for declining peripheral counts.     PHYSICAL EXAM:  Blood pressure (!) 181/81, pulse (!) 53, temperature 98.1 F (36.7 C), resp. rate 16, height 6' (1.829 m), weight 197 lb 12.8 oz (89.7 kg), SpO2 97 %. Wt Readings from Last 3 Encounters:  04/20/22 197 lb 12.8 oz (89.7 kg)  10/19/21 190 lb 4.8 oz (86.3 kg)  07/19/21 180 lb 14.4 oz (82.1 kg)   Body mass index is 26.83 kg/m. Performance status (ECOG): 1 - Symptomatic but completely ambulatory Physical Exam Constitutional:      Appearance: Normal appearance. He is not ill-appearing.  HENT:     Mouth/Throat:     Mouth: Mucous membranes are moist.     Pharynx: Oropharynx is clear. No oropharyngeal exudate or posterior oropharyngeal erythema.  Cardiovascular:     Rate and Rhythm: Normal rate and regular rhythm.     Heart sounds: No murmur heard.    No friction rub. No gallop.  Pulmonary:     Effort: Pulmonary effort is normal. No respiratory distress.     Breath sounds: Normal breath sounds. No wheezing, rhonchi or rales.  Abdominal:     General: Bowel sounds are normal. There is no distension.     Palpations: Abdomen is soft. There is no mass.     Tenderness: There is no abdominal tenderness.  Musculoskeletal:        General: No swelling.     Right lower leg: No edema.     Left lower leg: No edema.   Lymphadenopathy:     Cervical: No cervical adenopathy.     Upper Body:     Right upper body: No supraclavicular or axillary adenopathy.     Left upper body: No supraclavicular or axillary adenopathy.     Lower Body: No right inguinal adenopathy. No left inguinal adenopathy.  Skin:    General: Skin is warm.     Coloration: Skin is not jaundiced.     Findings: No lesion or rash.  Neurological:     General: No focal deficit present.     Mental Status: He is alert and oriented to person, place, and time. Mental status is at baseline.  Psychiatric:        Mood and Affect: Mood normal.        Behavior: Behavior normal.        Thought Content: Thought content normal.    LABS:    ASSESSMENT & PLAN:  A 81 y.o. male who I recently had seeing for mild pancytopenia, with primarily his white cells and red cells being affected.  I am very pleased with his peripheral counts today as all 3 cell lines have improved even more.  He clearly is no longer pancytopenic.  Based upon this and the fact that he is clinically doing well, he will continue to be followed  conservatively from a hematologic perspective.  I will see him back in 1 year for repeat clinical assessment.  The patient understands all the plans discussed today and is in agreement with them.   Bereket Gernert Macarthur Critchley, MD

## 2022-04-20 NOTE — Telephone Encounter (Signed)
04/20/22 Next appt scheduled and confirmed with patient

## 2022-05-04 ENCOUNTER — Other Ambulatory Visit: Payer: Self-pay | Admitting: Cardiology

## 2022-05-08 ENCOUNTER — Other Ambulatory Visit: Payer: Self-pay

## 2022-05-08 MED ORDER — SIMVASTATIN 40 MG PO TABS: 40.0000 mg | ORAL_TABLET | Freq: Every day | ORAL | 0 refills | Status: DC

## 2022-05-16 ENCOUNTER — Other Ambulatory Visit: Payer: Self-pay | Admitting: Cardiology

## 2022-05-23 ENCOUNTER — Other Ambulatory Visit: Payer: Self-pay | Admitting: Cardiology

## 2022-05-28 ENCOUNTER — Other Ambulatory Visit: Payer: Self-pay

## 2022-05-31 ENCOUNTER — Ambulatory Visit: Payer: Medicare HMO | Attending: Cardiology | Admitting: Cardiology

## 2022-05-31 ENCOUNTER — Encounter: Payer: Self-pay | Admitting: Cardiology

## 2022-05-31 VITALS — BP 142/72 | HR 58 | Ht 72.0 in | Wt 197.4 lb

## 2022-05-31 DIAGNOSIS — I10 Essential (primary) hypertension: Secondary | ICD-10-CM | POA: Diagnosis not present

## 2022-05-31 DIAGNOSIS — I2510 Atherosclerotic heart disease of native coronary artery without angina pectoris: Secondary | ICD-10-CM | POA: Diagnosis not present

## 2022-05-31 DIAGNOSIS — E782 Mixed hyperlipidemia: Secondary | ICD-10-CM

## 2022-05-31 DIAGNOSIS — R5383 Other fatigue: Secondary | ICD-10-CM | POA: Diagnosis not present

## 2022-05-31 DIAGNOSIS — Z131 Encounter for screening for diabetes mellitus: Secondary | ICD-10-CM

## 2022-05-31 DIAGNOSIS — Z1321 Encounter for screening for nutritional disorder: Secondary | ICD-10-CM

## 2022-05-31 MED ORDER — SIMVASTATIN 40 MG PO TABS: 40.0000 mg | ORAL_TABLET | Freq: Every day | ORAL | 3 refills | Status: DC

## 2022-05-31 NOTE — Patient Instructions (Addendum)
Medication Instructions:  Your physician recommends that you continue on your current medications as directed. Please refer to the Current Medication list given to you today.  *If you need a refill on your cardiac medications before your next appointment, please call your pharmacy*   Lab Work: Your physician recommends that you have labs done in the office today. Your test included  basic metabolic panel, complete blood count, TSH, vitamin D, A1C and lipids.  If you have labs (blood work) drawn today and your tests are completely normal, you will receive your results only by: [place] (if you have MyChart) OR A paper copy in the mail If you have any lab test that is abnormal or we need to change your treatment, we will call you to review the results.   Testing/Procedures: None ordered   Follow-Up: At [facility], you and your health needs are our priority.  As part of our continuing mission to provide you with exceptional heart care, we have created designated Provider Care Teams.  These Care Teams include your primary Cardiologist (physician) and Advanced Practice Providers (APPs -  Physician Assistants and Nurse Practitioners) who all work together to provide you with the care you need, when you need it.  We recommend signing up for the patient portal called "MyChart".  Sign up information is provided on this After Visit Summary.  MyChart is used to connect with patients for Virtual Visits (Telemedicine).  Patients are able to view lab/test results, encounter notes, upcoming appointments, etc.  Non-urgent messages can be sent to your provider as well.   To learn more about what you can do with MyChart, go to [email or web address].    Your next appointment:   9 month(s)  The format for your next appointment:   In Person  Provider:   Jyl Heinz, MD    Other Instructions none  Important Information About Sugar

## 2022-05-31 NOTE — Progress Notes (Signed)
Cardiology Office Note:    Date:  05/31/2022   ID:  [name], DOB [birth year], MRN [id number]  PCP:  Algis Greenhouse, MD  Cardiologist:  Jenean Lindau, MD   Referring MD: Algis Greenhouse, MD    ASSESSMENT:    1. Coronary artery disease involving native coronary artery of native heart without angina pectoris   2. Benign essential hypertension   3. Primary hypertension   4. Mixed hyperlipidemia    PLAN:    In order of problems listed above:  Coronary artery disease: Secondary prevention stressed with the patient.  Importance of compliance with diet medication stressed and vocalized understanding.  He was advised to walk at least half an hour a day 5 days a week and he vocalized understanding. Essential hypertension: Blood pressure is stable and diet was emphasized.  Lifestyle modification urged. Mixed dyslipidemia: Has not had blood work for some time and is fasting and we will do complete blood work today.  He requests hemoglobin A1c and has history of vitamin D deficiency so we will do the blood work also. Patient will be seen in follow-up appointment in 9 months or earlier if the patient has any concerns    Medication Adjustments/Labs and Tests Ordered: Current medicines are reviewed at length with the patient today.  Concerns regarding medicines are outlined above.  No orders of the defined types were placed in this encounter.  No orders of the defined types were placed in this encounter.    No chief complaint on file.    History of Present Illness:    [name] is a 82 y.o. male.  Patient has past medical history of coronary artery disease, essential hypertension, mixed dyslipidemia.  He denies any problems at this time and takes care of activities of daily living.  No chest pain orthopnea or PND.  At the time of my evaluation, the patient is alert awake oriented and in no distress.  He ambulates age appropriately.  Past Medical History:  Diagnosis Date    Acute posthemorrhagic anemia 05/04/2021   Formatting of this note might be different from the original. 01/6282: uncertain source, hosp x 2, mult transfusions   Adenomatous polyp of sigmoid colon 03/18/2019   Formatting of this note might be different from the original. 2020: tubular adenoma   Allergic rhinitis 08/05/2018   Arthritis    Atherosclerotic heart disease of native coronary artery without angina pectoris 04/05/2016   Formatting of this note is different from the original. 11/2012:  DES of LAD, D2 and RCA 04/2021: incidental CT: calcific CAD   Benign essential hypertension 04/05/2016   BPH (benign prostatic hyperplasia) 05/04/2021   Bradycardia by electrocardiogram 11/29/2017   CAD (coronary artery disease), native coronary artery 04/05/2016   Overview:  Formatting of this note may be different from the original.       11/2012:  DES of LAD, D2 and RCA     Cancer ([place])    Cardiac murmur 11/07/2020   Cardiomegaly 05/16/2021   Formatting of this note might be different from the original. 04/2021: incidental on CT   Cellulitis 10/20/2020   Formatting of this note might be different from the original. 10/20/2020   COVID-19 virus infection 02/08/2020   Formatting of this note might be different from the original. 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 04/2021: incidental on CT   Gastrointestinal hemorrhage, unspecified 05/11/2021   Formatting of this note might be different from the original. 70/6237: uncertain reason, hosp x 2, plan is outpatient SB camera   Hematochezia 05/04/2021   Hepatosplenomegaly 05/16/2021   Formatting of this  note might be different from the original. 04/2021: incidental on CT   Hiatal hernia 05/18/2021   History of cardiac cath 05/04/2021   Formatting of this note might be different from the original. 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 04/2021: incidental on CT   Peripheral arterial disease ([place]) 05/16/2021   Formatting of this note might be different from the original. 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 Mild to moderate mitral valve regurgitation. Mild aortic regurgitation    Past Surgical History:  Procedure Laterality Date   CARDIAC CATHETERIZATION     CHOLECYSTECTOMY     2002 [place] SURGERY     04/2012 [facility]   COLONOSCOPY     CORONARY ANGIOPLASTY     [facility]  2014   HERNIA REPAIR N/A    HIP ARTHROPLASTY     RIGHT HIP  2012   TOTAL HIP ARTHROPLASTY Left 07/21/2015   Procedure: LEFT TOTAL HIP ARTHROPLASTY ANTERIOR APPROACH;  Surgeon: Mcarthur Rossetti, MD;  Location: [facility];  Service: Orthopedics;  Laterality: Left;   UPPER GASTROINTESTINAL ENDOSCOPY N/A     Current Medications: Current Meds  Medication Sig   Ascorbic Acid (VITAMIN C) 1000 MG tablet Take 1,000 mg by mouth daily.   carvedilol (COREG) 6.25 MG tablet Take 6.25 mg by mouth 2 (two) times daily with a meal.   dutasteride (AVODART) 0.5 MG capsule Take 1 capsule by mouth daily.   gabapentin (NEURONTIN) 100 MG capsule Take 100 mg by mouth as needed (pain).  hydrochlorothiazide (MICROZIDE) 12.5 MG capsule Take 1 capsule (12.5 mg total) by mouth daily. Patient needs appointment for further refills. 3rd attempt   pantoprazole (PROTONIX) 40 MG tablet Take 40 mg by mouth daily.   polyethylene glycol powder (GLYCOLAX/MIRALAX) 17 GM/SCOOP powder Take 17 g by mouth as needed for mild constipation or moderate constipation.   simvastatin (ZOCOR) 40 MG tablet Take 1 tablet (40 mg total) by mouth daily at 6 PM. Patient must keep appointment for 05/31/22 for further refills. 3 rd/final attempt   tamsulosin (FLOMAX) 0.4 MG CAPS capsule Take 0.4 mg by mouth 2 (two) times daily.      Allergies:   Patient has no known allergies.   Social History   Socioeconomic History   Marital status: Married    Spouse name: Not on file   Number of children: 4   Years of education: Not on file   Highest education level: Not on file  Occupational History   Not on file   Tobacco Use   Smoking status: Former    Packs/day: 0.50    Years: 17.00    Total pack years: 8.50    Types: Cigarettes    Quit date: 63    Years since quitting: 50.0   Smokeless tobacco: Never  Vaping Use   Vaping Use: Never used  Substance and Sexual Activity   Alcohol use: Not Currently    Comment: quit drinking age 68   Drug use: No   Sexual activity: Not on file  Other Topics Concern   Not on file  Social History Narrative   Not on file   Social Determinants of Health   Financial Resource Strain: Not on file  Food Insecurity: Not on file  Transportation Needs: Not on file  Physical Activity: Not on file  Stress: Not on file  Social Connections: Not on file     Family History: The patient's family history includes Cancer in his sister; Colon cancer in his mother; Diabetes in his father and sister; Heart disease in his father; Hypertension in his father.  ROS:   Please see the history of present illness.    All other systems reviewed and are negative.  EKGs/Labs/Other Studies Reviewed:    The following studies were reviewed today: EKG reveals sinus rhythm with LVH and nonspecific ST-T changes   Recent Labs: 04/20/2022: ALT 13; BUN 14; Creatinine 0.8; Hemoglobin 13.2; Platelets 212; Potassium 4.5; Sodium 138; TSH 1.070  Recent Lipid Panel    Component Value Date/Time   CHOL 137 02/26/2019 1553   TRIG 240 (H) 02/26/2019 1553   HDL 50 02/26/2019 1553   CHOLHDL 2.7 02/26/2019 1553   LDLCALC 49 02/26/2019 1553    Physical Exam:    VS:  BP (!) 142/72   Pulse (!) 58   Ht 6' (1.829 m)   Wt 197 lb 6.4 oz (89.5 kg)   SpO2 97%   BMI 26.77 kg/m     Wt Readings from Last 3 Encounters:  05/31/22 197 lb 6.4 oz (89.5 kg)  04/20/22 197 lb 12.8 oz (89.7 kg)  10/19/21 190 lb 4.8 oz (86.3 kg)     GEN: Patient is in no acute distress HEENT: Normal NECK: No JVD; No carotid bruits LYMPHATICS: No lymphadenopathy CARDIAC: Hear sounds regular, 2/6 systolic  murmur at the apex. RESPIRATORY:  Clear to auscultation without rales, wheezing or rhonchi  ABDOMEN: Soft, non-tender, non-distended MUSCULOSKELETAL:  No edema; No deformity  SKIN: Warm and dry NEUROLOGIC:  Alert and oriented  x 3 PSYCHIATRIC:  Normal affect   Signed, Jenean Lindau, MD  05/31/2022 11:11 AM    [place]

## 2022-06-01 LAB — CBC
Hematocrit: 39 % (ref 37.5–51.0)
Hemoglobin: 14 g/dL (ref 13.0–17.7)
MCH: 34.9 pg — ABNORMAL HIGH (ref 26.6–33.0)
MCHC: 35.9 g/dL — ABNORMAL HIGH (ref 31.5–35.7)
MCV: 97 fL (ref 79–97)
Platelets: 200 10*3/uL (ref 150–450)
RBC: 4.01 x10E6/uL — ABNORMAL LOW (ref 4.14–5.80)
RDW: 13.6 % (ref 11.6–15.4)
WBC: 8.4 10*3/uL (ref 3.4–10.8)

## 2022-06-01 LAB — LIPID PANEL
Chol/HDL Ratio: 2.5 ratio (ref 0.0–5.0)
Cholesterol, Total: 129 mg/dL (ref 100–199)
HDL: 52 mg/dL (ref >39)
LDL Chol Calc (NIH): 58 mg/dL (ref 0–99)
Triglycerides: 104 mg/dL (ref 0–149)
VLDL Cholesterol Cal: 19 mg/dL (ref 5–40)

## 2022-06-01 LAB — COMPREHENSIVE METABOLIC PANEL
ALT: 10 IU/L (ref 0–44)
AST: 15 IU/L (ref 0–40)
Albumin/Globulin Ratio: 1.5 (ref 1.2–2.2)
Albumin: 4 g/dL (ref 3.8–4.8)
Alkaline Phosphatase: 76 IU/L (ref 44–121)
BUN/Creatinine Ratio: 10 (ref 10–24)
BUN: 10 mg/dL (ref 8–27)
Bilirubin Total: 0.8 mg/dL (ref 0.0–1.2)
CO2: 26 mmol/L (ref 20–29)
Calcium: 9.2 mg/dL (ref 8.6–10.2)
Chloride: 102 mmol/L (ref 96–106)
Creatinine, Ser: 0.98 mg/dL (ref 0.76–1.27)
Globulin, Total: 2.6 g/dL (ref 1.5–4.5)
Glucose: 91 mg/dL (ref 70–99)
Potassium: 4.5 mmol/L (ref 3.5–5.2)
Sodium: 143 mmol/L (ref 134–144)
Total Protein: 6.6 g/dL (ref 6.0–8.5)
eGFR: 78 mL/min/{1.73_m2} (ref >59)

## 2022-06-01 LAB — HEMOGLOBIN A1C
Est. average glucose Bld gHb Est-mCnc: 100 mg/dL
Hgb A1c MFr Bld: 5.1 % (ref 4.8–5.6)

## 2022-06-01 LAB — TSH: TSH: 1.12 u[IU]/mL (ref 0.450–4.500)

## 2022-06-01 LAB — VITAMIN D 25 HYDROXY (VIT D DEFICIENCY, FRACTURES): Vit D, 25-Hydroxy: 27.7 ng/mL — ABNORMAL LOW (ref 30.0–100.0)

## 2022-06-07 ENCOUNTER — Other Ambulatory Visit: Payer: Self-pay | Admitting: Cardiology

## 2022-06-07 ENCOUNTER — Other Ambulatory Visit: Payer: Self-pay

## 2022-06-07 MED ORDER — HYDROCHLOROTHIAZIDE 12.5 MG PO CAPS: 12.5000 mg | ORAL_CAPSULE | Freq: Every day | ORAL | 10 refills | Status: DC

## 2022-07-03 DIAGNOSIS — E559 Vitamin D deficiency, unspecified: Secondary | ICD-10-CM

## 2022-07-03 HISTORY — DX: Vitamin D deficiency, unspecified: E55.9

## 2022-08-22 ENCOUNTER — Other Ambulatory Visit: Payer: Self-pay | Admitting: Cardiology

## 2022-08-31 ENCOUNTER — Other Ambulatory Visit: Payer: Self-pay

## 2022-08-31 MED ORDER — HYDROCHLOROTHIAZIDE 12.5 MG PO CAPS: 12.5000 mg | ORAL_CAPSULE | Freq: Every day | ORAL | 3 refills | Status: DC

## 2022-12-20 ENCOUNTER — Telehealth: Payer: Self-pay | Admitting: Cardiology

## 2022-12-20 MED ORDER — NITROGLYCERIN 0.4 MG SL SUBL: 0.4000 mg | SUBLINGUAL_TABLET | SUBLINGUAL | 4 refills | Status: AC | PRN

## 2022-12-20 NOTE — Telephone Encounter (Signed)
*  STAT* If patient is at the pharmacy, call can be transferred to refill team.   1. Which medications need to be refilled? (please list name of each medication and dose if known) nitroGLYCERIN (NITROSTAT) 0.4 MG SL tablet    2. Would you like to learn more about the convenience, safety, & potential cost savings by using the [facility]? yes   3. Are you open to using the [facility] (Type [facility].  ).needs pharmacy close to home in [place]   4. Which pharmacy/location (including [place] and city if local pharmacy) is medication to be sent to? [facility] [phone] - [place], [place] - [address] OF [address]    5. Do they need a 30 day or 90 day supply? 30 day

## 2022-12-20 NOTE — Telephone Encounter (Signed)
Refill of Nitroglycerin 0.4 mg sent to [facility] on [address].

## 2023-03-27 ENCOUNTER — Other Ambulatory Visit: Payer: Self-pay | Admitting: Cardiology

## 2023-04-01 ENCOUNTER — Other Ambulatory Visit: Payer: Self-pay | Admitting: Cardiology

## 2023-04-21 NOTE — Progress Notes (Unsigned)
[facility]  [address] [place],  [place]  [address] [phone]  Clinic Day:  04/21/2023  Referring physician: Olive Bass, MD   HISTORY OF PRESENT ILLNESS:  The patient is a 82 y.o. male who I recently began seeing for pancytopenia.  A recent work-up did not reveal any obvious etiology behind his low blood counts.  As his peripheral counts have not been particularly low, they have been followed conservatively.  He comes back in today to reassess his counts.  Since his last visit, the patient has been doing well.  He denies having any new symptoms or findings which concern him for declining peripheral counts.      PHYSICAL EXAM:  There were no vitals taken for this visit. Wt Readings from Last 3 Encounters:  05/31/22 197 lb 6.4 oz (89.5 kg)  04/20/22 197 lb 12.8 oz (89.7 kg)  10/19/21 190 lb 4.8 oz (86.3 kg)   There is no height or weight on file to calculate BMI. Performance status (ECOG): {CHL ONC [id number] Physical Exam  LABS:      Latest Ref Rng & Units 05/31/2022   11:25 AM 04/20/2022   12:00 AM 10/19/2021   12:00 AM  CBC  WBC 3.4 - 10.8 x10E3/uL 8.4  7.5     3.2      Hemoglobin 13.0 - 17.7 g/dL 08.6  57.8     46.9      Hematocrit 37.5 - 51.0 % 39.0  39     40      Platelets 150 - 450 x10E3/uL 200  212     178         This result is from an external source.      Latest Ref Rng & Units 05/31/2022   11:25 AM 04/20/2022   12:00 AM 07/05/2021   12:00 AM  CMP  Glucose 70 - 99 mg/dL 91     BUN 8 - 27 mg/dL 10  14     15    Creatinine 0.76 - 1.27 mg/dL 6.29  0.8     0.9   Sodium 134 - 144 mmol/L 143  138     137   Potassium 3.5 - 5.2 mmol/L 4.5  4.5     4.0   Chloride 96 - 106 mmol/L 102  104     105   CO2 20 - 29 mmol/L 26  30     29    Calcium 8.6 - 10.2 mg/dL 9.2  8.6     8.8   Total Protein 6.0 - 8.5 g/dL 6.6     Total Bilirubin 0.0 - 1.2 mg/dL 0.8     Alkaline Phos 44 - 121 IU/L 76  68     84   AST 0 - 40 IU/L 15  22      41   ALT 0 - 44 IU/L 10  13     14       This result is from an external source.     No results found for: "CEA1", "CEA" / No results found for: "CEA1", "CEA" No results found for: "PSA1" No results found for: "BMW413" No results found for: "CAN125"  Lab Results  Component Value Date   TOTALPROTELP 6.5 07/05/2021   ALBUMINELP 2.7 (L) 07/05/2021   A1GS 0.3 07/05/2021   A2GS 0.4 07/05/2021   BETS 0.9 07/05/2021   GAMS 2.2 (H) 07/05/2021   MSPIKE Not Observed 07/05/2021   SPEI Comment 07/05/2021  Lab Results  Component Value Date   TIBC 234 (L) 07/05/2021   FERRITIN 290 07/05/2021   IRONPCTSAT 21 07/05/2021   No results found for: "LDH"     Component Value Date/Time   TOTALPROTELP 6.5 07/05/2021 1111   ALBUMINELP 2.7 (L) 07/05/2021 1111   A1GS 0.3 07/05/2021 1111   A2GS 0.4 07/05/2021 1111   BETS 0.9 07/05/2021 1111   GAMS 2.2 (H) 07/05/2021 1111   MSPIKE Not Observed 07/05/2021 1111   SPEI Comment 07/05/2021 1111    Review Flowsheet       Latest Ref Rng & Units 07/05/2021  Oncology Labs  Ferritin 24 - 336 ng/mL 290   %SAT 17.9 - 39.5 % 21   Total Protein ELP 6.0 - 8.5 g/dL 6.5   Albumin ELP 2.9 - 4.4 g/dL 2.7   Alpha-1 Globulin 0.0 - 0.4 g/dL 0.3   Alpha-2 Globulin 0.4 - 1.0 g/dL 0.4   Beta Globulin 0.7 - 1.3 g/dL 0.9   Gamma Globulin 0.4 - 1.8 g/dL 2.2   M-Spike, % Not Observed g/dL Not Observed   SPE Interp. - Comment     Details             STUDIES:  No results found.    ASSESSMENT & PLAN:   Assessment/Plan:  A 82 y.o. male who I recently had seeing for mild pancytopenia, with primarily his white cells and red cells being affected.  I am very pleased with his peripheral counts today as all 3 cell lines have improved even more.  He clearly is no longer pancytopenic.  Based upon this and the fact that he is clinically doing well, he will continue to be followed conservatively from a hematologic perspective.  I will see him back in 1 year for  repeat clinical assessment.  The patient understands all the plans discussed today and is in agreement with them.     Antron Seth Kirby Funk, MD

## 2023-04-22 ENCOUNTER — Other Ambulatory Visit: Payer: Self-pay | Admitting: Oncology

## 2023-04-22 ENCOUNTER — Inpatient Hospital Stay: Payer: Medicare HMO | Admitting: Oncology

## 2023-04-22 ENCOUNTER — Inpatient Hospital Stay: Payer: Medicare HMO

## 2023-04-22 DIAGNOSIS — D759 Disease of blood and blood-forming organs, unspecified: Secondary | ICD-10-CM

## 2023-04-26 ENCOUNTER — Ambulatory Visit: Payer: Medicare HMO | Admitting: Cardiology

## 2023-05-05 NOTE — Progress Notes (Unsigned)
[facility]  [address] [place],  [place]  [address] [phone]  Clinic Day:  05/06/2023  Referring physician: Olive Bass, MD   HISTORY OF PRESENT ILLNESS:  The patient is a 82 y.o. 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  Fortunately, his labs over the past few years have risen over time to where I am not giving him any intervention for his low blood counts.  He comes in today for repeat clinical assessment.  Since his last visit, the patient has been doing fairly well.  He continues to deny having any overt forms of blood loss.  He also denies being placed on any medications which have the ability to cause cytopenias via bone marrow suppression.   PHYSICAL EXAM:  Blood pressure (!) 157/75, pulse (!) 54, temperature 98.3 F (36.8 C), temperature source Oral, resp. rate 18, height 6' (1.829 m), weight 199 lb 8 oz (90.5 kg), SpO2 100%. Wt Readings from Last 3 Encounters:  05/06/23 199 lb 8 oz (90.5 kg)  05/31/22 197 lb 6.4 oz (89.5 kg)  04/20/22 197 lb 12.8 oz (89.7 kg)   Body mass index is 27.06 kg/m. Performance status (ECOG): 1 - Symptomatic but completely ambulatory Physical Exam  LABS:      Latest Ref Rng & Units 05/06/2023    1:17 PM 05/31/2022   11:25 AM 04/20/2022   12:00 AM  CBC  WBC 4.0 - 10.5 K/uL 7.1  8.4  7.5      Hemoglobin 13.0 - 17.0 g/dL 91.4  78.2  95.6      Hematocrit 39.0 - 52.0 % 40.0  39.0  39      Platelets 150 - 400 K/uL 185  200  212         This result is from an external source.      Latest Ref Rng & Units 05/31/2022   11:25 AM 04/20/2022   12:00 AM 07/05/2021   12:00 AM  CMP  Glucose 70 - 99 mg/dL 91     BUN 8 - 27 mg/dL 10  14     15    Creatinine 0.76 - 1.27 mg/dL 2.13  0.8     0.9   Sodium 134 - 144 mmol/L 143  138     137   Potassium 3.5 - 5.2 mmol/L 4.5  4.5     4.0   Chloride 96 - 106 mmol/L 102  104     105   CO2 20 - 29 mmol/L 26  30     29    Calcium 8.6 - 10.2 mg/dL 9.2  8.6      8.8   Total Protein 6.0 - 8.5 g/dL 6.6     Total Bilirubin 0.0 - 1.2 mg/dL 0.8     Alkaline Phos 44 - 121 IU/L 76  68     84   AST 0 - 40 IU/L 15  22     41   ALT 0 - 44 IU/L 10  13     14       This result is from an external source.    ASSESSMENT & PLAN:  Assessment/Plan:  An 82 y.o. male with a history of mild pancytopenia.  Per his labs today, I am extremely pleased that all of his peripheral counts have normalized.  This has occurred without him needing any form of intervention for his cytopenias.  As he is clinically doing well and his  peripheral counts are normal, I do feel comfortable turning his care back over to his primary care office.  My recommendation would be for a CBC to be checked 1-2 times per year at his primary care office.  I would not have a problem seeing this patient in the future if new hematologic issues arise that require repeat clinical assessment.  The patient understands all the plans discussed today and is in agreement with them.   Regine Christian Kirby Funk, MD

## 2023-05-06 ENCOUNTER — Inpatient Hospital Stay (HOSPITAL_BASED_OUTPATIENT_CLINIC_OR_DEPARTMENT_OTHER): Payer: Medicare HMO | Admitting: Oncology

## 2023-05-06 ENCOUNTER — Inpatient Hospital Stay: Payer: Medicare HMO | Attending: Oncology

## 2023-05-06 VITALS — BP 157/75 | HR 54 | Temp 98.3°F | Resp 18 | Ht 72.0 in | Wt 199.5 lb

## 2023-05-06 DIAGNOSIS — D759 Disease of blood and blood-forming organs, unspecified: Secondary | ICD-10-CM

## 2023-05-06 DIAGNOSIS — D61818 Other pancytopenia: Secondary | ICD-10-CM | POA: Insufficient documentation

## 2023-05-06 LAB — CBC WITH DIFFERENTIAL (CANCER CENTER ONLY)
Abs Immature Granulocytes: 0.03 10*3/uL (ref 0.00–0.07)
Basophils Absolute: 0.1 10*3/uL (ref 0.0–0.1)
Basophils Relative: 1 %
Eosinophils Absolute: 0.1 10*3/uL (ref 0.0–0.5)
Eosinophils Relative: 1 %
HCT: 40 % (ref 39.0–52.0)
Hemoglobin: 14 g/dL (ref 13.0–17.0)
Immature Granulocytes: 0 %
Lymphocytes Relative: 28 %
Lymphs Abs: 2 10*3/uL (ref 0.7–4.0)
MCH: 33.1 pg (ref 26.0–34.0)
MCHC: 35 g/dL (ref 30.0–36.0)
MCV: 94.6 fL (ref 80.0–100.0)
Monocytes Absolute: 0.5 10*3/uL (ref 0.1–1.0)
Monocytes Relative: 6 %
Neutro Abs: 4.5 10*3/uL (ref 1.7–7.7)
Neutrophils Relative %: 64 %
Platelet Count: 185 10*3/uL (ref 150–400)
RBC: 4.23 MIL/uL (ref 4.22–5.81)
RDW: 13.6 % (ref 11.5–15.5)
WBC Count: 7.1 10*3/uL (ref 4.0–10.5)
nRBC: 0 % (ref 0.0–0.2)
nRBC: 0 /100{WBCs}

## 2023-05-10 ENCOUNTER — Other Ambulatory Visit: Payer: Self-pay | Admitting: Cardiology

## 2023-05-20 ENCOUNTER — Ambulatory Visit (HOSPITAL_BASED_OUTPATIENT_CLINIC_OR_DEPARTMENT_OTHER)
Admission: EM | Admit: 2023-05-20 | Discharge: 2023-05-20 | Disposition: A | Discharge status: Home / Self Care | Payer: Medicare HMO | Attending: Internal Medicine | Admitting: Internal Medicine

## 2023-05-20 ENCOUNTER — Encounter (HOSPITAL_BASED_OUTPATIENT_CLINIC_OR_DEPARTMENT_OTHER): Payer: Self-pay

## 2023-05-20 DIAGNOSIS — J208 Acute bronchitis due to other specified organisms: Secondary | ICD-10-CM | POA: Diagnosis not present

## 2023-05-20 DIAGNOSIS — Z87891 Personal history of nicotine dependence: Secondary | ICD-10-CM | POA: Diagnosis not present

## 2023-05-20 DIAGNOSIS — B9689 Other specified bacterial agents as the cause of diseases classified elsewhere: Secondary | ICD-10-CM | POA: Diagnosis not present

## 2023-05-20 DIAGNOSIS — R0602 Shortness of breath: Secondary | ICD-10-CM | POA: Diagnosis not present

## 2023-05-20 MED ORDER — DOXYCYCLINE HYCLATE 100 MG PO CAPS: 100.0000 mg | ORAL_CAPSULE | Freq: Two times a day (BID) | ORAL | 0 refills | Status: AC

## 2023-05-20 MED ORDER — BENZONATATE 100 MG PO CAPS: 100.0000 mg | ORAL_CAPSULE | Freq: Three times a day (TID) | ORAL | 0 refills | Status: DC

## 2023-05-20 MED ORDER — ALBUTEROL SULFATE (2.5 MG/3ML) 0.083% IN NEBU
2.5000 mg | INHALATION_SOLUTION | Freq: Once | RESPIRATORY_TRACT | Status: AC
  Administered 2023-05-20: 2.5 mg via RESPIRATORY_TRACT

## 2023-05-20 MED ORDER — ALBUTEROL SULFATE HFA 108 (90 BASE) MCG/ACT IN AERS: 1.0000 | INHALATION_SPRAY | Freq: Four times a day (QID) | RESPIRATORY_TRACT | 0 refills | Status: AC | PRN

## 2023-05-20 MED ORDER — DEXAMETHASONE SODIUM PHOSPHATE 10 MG/ML IJ SOLN
10.0000 mg | Freq: Once | INTRAMUSCULAR | Status: AC
  Administered 2023-05-20: 10 mg via INTRAMUSCULAR

## 2023-05-20 NOTE — ED Provider Notes (Signed)
[name]    CSN: [id number] Arrival date & time: 05/20/23  1478      History   Chief Complaint Chief Complaint  Patient presents with   Cough   Nasal Congestion    HPI [name] is a 82 y.o. male.   [name] is a 82 y.o. male presenting for chief complaint of Cough and Nasal Congestion that started 2 weeks ago. Cough is productive, he is unsure of color of mucous. Cough improved slightly, then worsened 2-3 days ago. Reports intermittent shortness of breath associated with coughing fits but denies chest pain, nausea, vomiting, diarrhea, abdominal pain, leg swelling, orthopnea, dizziness, headache, and vision changes.  Reports multiple recent sick contacts with similar symptoms some of whom have been diagnosed with atypical pneumonia.  Former smoker, denies current cigarette and drug use.  No history of chronic respiratory problems.  History of cardiomegaly, CAD, hypertension, peripheral arterial disease, and pulmonary hypertension.  Takes all daily medications as prescribed.  No allergies to antibiotics.  Wife is giving him over-the-counter medications and they are not helping at all with his symptoms.   Cough   Past Medical History:  Diagnosis Date   Acute posthemorrhagic anemia 05/04/2021   Formatting of this note might be different from the original. 04/2021: uncertain source, hosp x 2, mult transfusions   Adenomatous polyp of sigmoid colon 03/18/2019   Formatting of this note might be different from the original. 2020: tubular adenoma   Allergic rhinitis 08/05/2018   Arthritis    Atherosclerotic heart disease of native coronary artery without angina pectoris 04/05/2016   Formatting of this note is different from the original. 11/2012:  DES of LAD, D2 and RCA 04/2021: incidental CT: calcific CAD   Benign essential hypertension 04/05/2016   BPH (benign prostatic hyperplasia) 05/04/2021   Bradycardia by electrocardiogram 11/29/2017   CAD (coronary  artery disease), native coronary artery 04/05/2016   Overview:  Formatting of this note may be different from the original.       11/2012:  DES of LAD, D2 and RCA     Cancer (HCC)    Cardiac murmur 11/07/2020   Cardiomegaly 05/16/2021   Formatting of this note might be different from the original. 04/2021: incidental on CT   Cellulitis 10/20/2020   Formatting of this note might be different from the original. 10/20/2020   COVID-19 virus infection 02/08/2020   Formatting of this note might be different from the original. 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 04/2021: incidental on CT   Gastrointestinal hemorrhage, unspecified 05/11/2021   Formatting of this note might be different from the original. 04/2021: uncertain reason, hosp x 2, plan is outpatient SB camera   Hematochezia 05/04/2021   Hepatosplenomegaly 05/16/2021   Formatting of this note might be different from the original. 04/2021: incidental on CT   Hiatal hernia 05/18/2021   History of cardiac cath 05/04/2021   Formatting of this note might be different from the original. 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 04/2021: incidental on CT   Peripheral arterial disease (HCC) 05/16/2021   Formatting of this note might be different from the original. 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 Mild to moderate mitral valve regurgitation. 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   Hiatal hernia 05/18/2021   Iron deficiency anemia due to chronic blood loss 05/17/2021   Fatty liver 05/16/2021   Hepatosplenomegaly 05/16/2021   Neurocognitive deficits 05/16/2021   Pericardial effusion 05/16/2021   Peripheral arterial disease (HCC) 05/16/2021   Cardiomegaly 05/16/2021   Memory loss 05/15/2021   Protein-calorie malnutrition, severe (HCC) 05/15/2021   Slow transit constipation 05/13/2021   Gastrointestinal hemorrhage, unspecified 05/11/2021    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   Diastolic dysfunction 05/04/2021   Hematochezia 05/04/2021   History of cardiac cath 05/04/2021   Pulmonary HTN (HCC) 05/04/2021   Valvular regurgitation 05/04/2021   BPH (benign prostatic hyperplasia) 05/04/2021   Acute posthemorrhagic anemia 05/04/2021   Palpitations 11/07/2020   Cardiac murmur 11/07/2020   Preop cardiovascular exam 11/07/2020   Arthritis 11/04/2020   Cancer (HCC) 11/04/2020   Dysrhythmia 11/04/2020   Hypertension 11/04/2020   Jaundice 11/04/2020   PONV (postoperative nausea and vomiting) 11/04/2020   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 11/04/2020   Cellulitis 10/20/2020   Iron deficiency anemia 10/20/2020   Other fatigue 10/13/2020   Skin tear of left upper arm without complication 03/15/2020   History of COVID-19 02/18/2020   COVID-19 virus infection 02/08/2020   Mixed hyperlipidemia 07/01/2019   Suspected COVID-19 virus infection 04/23/2019   Adenomatous polyp of sigmoid colon 03/18/2019   Allergic rhinitis 08/05/2018   Respiratory infection 05/09/2018   Postoperative examination 04/24/2018   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   Bradycardia by electrocardiogram 11/29/2017   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   Benign essential hypertension 04/05/2016   CAD (coronary artery disease), native coronary artery 04/05/2016   Enlarged prostate with lower urinary tract symptoms (LUTS) 04/05/2016   Erectile dysfunction 04/05/2016   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   Atherosclerotic heart disease of native coronary artery without angina pectoris 04/05/2016   Osteoarthritis of left hip 07/21/2015   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   Osteoarthritis of hip 07/21/2015   Preoperative cardiovascular examination 05/05/2015    Past Surgical History:  Procedure Laterality Date   CARDIAC CATHETERIZATION     CHOLECYSTECTOMY     2002 [facility]   COLON SURGERY     04/2012 [facility]   COLONOSCOPY     CORONARY ANGIOPLASTY     [facility]  2014   HERNIA REPAIR N/A    HIP ARTHROPLASTY     RIGHT HIP  2012   TOTAL HIP ARTHROPLASTY Left 07/21/2015   Procedure: LEFT TOTAL HIP ARTHROPLASTY ANTERIOR APPROACH;  Surgeon: Kathryne Hitch, MD;  Location: [facility] OR;  Service: Orthopedics;  Laterality: Left;   UPPER GASTROINTESTINAL ENDOSCOPY N/A        Home Medications    Prior to Admission medications   Medication Sig Start Date End Date Taking? Authorizing Provider  albuterol (VENTOLIN HFA) 108 (90 Base) MCG/ACT inhaler Inhale 1-2 puffs into the lungs every 6 (six) hours as needed for wheezing or shortness of breath. 05/20/23  Yes Carlisle Beers, FNP  benzonatate (TESSALON) 100 MG capsule Take 1 capsule (100 mg total) by mouth every 8 (eight) hours. 05/20/23  Yes Carlisle Beers, FNP  doxycycline (VIBRAMYCIN) 100 MG capsule Take 1 capsule (100 mg total) by mouth 2 (two) times daily for 7 days. 05/20/23 05/27/23 Yes StanhopeDonavan Burnet, FNP  Ascorbic Acid (VITAMIN C) 1000 MG tablet Take 1,000 mg by mouth daily.    [provider]  carvedilol (COREG) 6.25 MG tablet Take 1 tablet (6.25 mg total) by mouth 2 (two) times daily. 04/01/23   Revankar, Aundra Dubin, MD  dutasteride (AVODART) 0.5 MG capsule Take 1 capsule by mouth daily. 01/03/19   [provider]  gabapentin (NEURONTIN) 100 MG capsule Take 100 mg by mouth as needed (pain).  09/10/17   [provider]  hydrochlorothiazide (MICROZIDE) 12.5 MG capsule Take 1 capsule (12.5 mg total) by mouth daily. 08/31/22   Revankar, Aundra Dubin, MD  nitroGLYCERIN (NITROSTAT) 0.4 MG SL tablet Place 1 tablet (0.4 mg total) under the tongue every 5 (five) minutes as needed for chest pain. 12/20/22   Revankar, Aundra Dubin, MD  pantoprazole (PROTONIX) 40 MG tablet Take 40 mg by mouth daily. 05/18/21   [provider]  polyethylene glycol powder  (GLYCOLAX/MIRALAX) 17 GM/SCOOP powder Take 17 g by mouth as needed for mild constipation or moderate constipation. 05/19/21   [provider]  simvastatin (ZOCOR) 40 MG tablet TAKE 1 TABLET EVERY DAY 05/10/23   Revankar, Aundra Dubin, MD  tamsulosin (FLOMAX) 0.4 MG CAPS capsule Take 0.4 mg by mouth 2 (two) times daily.  06/17/15   [provider]    Family History Family History  Problem Relation Age of Onset   Colon cancer Mother    Hypertension Father    Diabetes Father    Heart disease Father    Diabetes Sister    Cancer Sister     Social History Social History   Tobacco Use   Smoking status: Former    Current packs/day: 0.00    Average packs/day: 0.5 packs/day for 17.0 years (8.5 ttl pk-yrs)    Types: Cigarettes    Start date: 9    Quit date: 1974    Years since quitting: 13.0  Smokeless tobacco: Never  Vaping Use   Vaping status: Never Used  Substance Use Topics   Alcohol use: Not Currently    Comment: quit drinking age 35   Drug use: No     Allergies   Patient has no known allergies.   Review of Systems Review of Systems  Respiratory:  Positive for cough.   Per HPI   Physical Exam Triage Vital Signs ED Triage Vitals [05/20/23 1100]  Encounter Vitals Group     BP (!) 143/76     Systolic BP Percentile      Diastolic BP Percentile      Pulse Rate (!) 59     Resp 20     Temp 98.1 F (36.7 C)     Temp Source Oral     SpO2 93 %     Weight 210 lb (95.3 kg)     Height      Head Circumference      Peak Flow      Pain Score 0     Pain Loc      Pain Education      Exclude from Growth Chart    No data found.  Updated Vital Signs BP (!) 143/76 (BP Location: Right Arm)   Pulse (!) 59   Temp 98.1 F (36.7 C) (Oral)   Resp 20   Wt 210 lb (95.3 kg)   SpO2 93%   BMI 28.48 kg/m   Visual Acuity Right Eye Distance:   Left Eye Distance:   Bilateral Distance:    Right Eye Near:   Left Eye Near:    Bilateral Near:     Physical  Exam Vitals and nursing note reviewed.  Constitutional:      Appearance: He is not ill-appearing or toxic-appearing.  HENT:     Head: Normocephalic and atraumatic.     Right Ear: Hearing, tympanic membrane, ear canal and external ear normal.     Left Ear: Hearing, tympanic membrane, ear canal and external ear normal.     Nose: Nose normal.     Mouth/Throat:     Lips: Pink.     Mouth: Mucous membranes are moist. No injury or oral lesions.     Dentition: Normal dentition.     Tongue: No lesions.     Pharynx: Oropharynx is clear. Uvula midline. Posterior oropharyngeal erythema present. No pharyngeal swelling, oropharyngeal exudate, uvula swelling or postnasal drip.     Tonsils: No tonsillar exudate.  Eyes:     General: Lids are normal. Vision grossly intact. Gaze aligned appropriately.     Extraocular Movements: Extraocular movements intact.     Conjunctiva/sclera: Conjunctivae normal.  Neck:     Trachea: Trachea and phonation normal.  Cardiovascular:     Rate and Rhythm: Normal rate and regular rhythm.     Heart sounds: Normal heart sounds, S1 normal and S2 normal.  Pulmonary:     Effort: Pulmonary effort is normal. No respiratory distress.     Breath sounds: Normal air entry. Wheezing (Faint expiratory wheezing heard to bilateral lower lung fields) present. No rhonchi or rales.  Chest:     Chest wall: No tenderness.  Abdominal:     General: Bowel sounds are normal.     Palpations: Abdomen is soft.     Tenderness: There is no abdominal tenderness. There is no right CVA tenderness, left CVA tenderness or guarding.  Musculoskeletal:     Cervical back: Neck supple.     Right lower  leg: No edema.     Left lower leg: No edema.  Lymphadenopathy:     Cervical: No cervical adenopathy.  Skin:    General: Skin is warm and dry.     Capillary Refill: Capillary refill takes less than 2 seconds.     Findings: No rash.  Neurological:     General: No focal deficit present.     Mental  Status: He is alert and oriented to person, place, and time. Mental status is at baseline.     Cranial Nerves: No dysarthria or facial asymmetry.  Psychiatric:        Mood and Affect: Mood normal.        Speech: Speech normal.        Behavior: Behavior normal.        Thought Content: Thought content normal.        Judgment: Judgment normal.      UC Treatments / Results  Labs (all labs ordered are listed, but only abnormal results are displayed) Labs Reviewed - No data to display  EKG   Radiology No results found.  Procedures Procedures (including critical care time)  Medications Ordered in UC Medications  dexamethasone (DECADRON) injection 10 mg (10 mg Intramuscular Given 05/20/23 1148)  albuterol (PROVENTIL) (2.5 MG/3ML) 0.083% nebulizer solution 2.5 mg (2.5 mg Nebulization Given 05/20/23 1148)    Initial Impression / Assessment and Plan / UC Course  I have reviewed the triage vital signs and the nursing notes.  Pertinent labs & imaging results that were available during my care of the patient were reviewed by me and considered in my medical decision making (see chart for details).   1.  Acute bacterial bronchitis, former smoker, shortness of breath Presentation suspicious for acute bronchitis, would like to cover for atypical bacteria given former smoking status and risk for atypical pneumonia with recent exposures.    We do not currently have imaging capability at our urgent care center, recommended outpatient chest x-ray, patient and wife declined stating they would not like to have this done.  Doxycycline ordered to be taken every 12 hours for the next 7 days.  Albuterol breathing treatment provided in clinic with significant relief of shortness of breath and wheezing.  Overall nontoxic in appearance. May follow-up with PCP.   Counseled patient on potential for adverse effects with medications prescribed/recommended today, strict ER and return-to-clinic precautions  discussed, patient verbalized understanding.    Final Clinical Impressions(s) / UC Diagnoses   Final diagnoses:  Acute bacterial bronchitis  Former smoker  Shortness of breath     Discharge Instructions      You have bronchitis which is inflammation of the upper airways in your lungs due to a virus. The following medicines will help with your symptoms.   - I gave you a shot of steroid in the clinic to help with inflammation to the chest. - Take antibiotic as prescribed. - You may use albuterol inhaler 1 to 2 puffs every 4-6 hours as needed for cough, shortness of breath, and wheezing. - Take cough medicines as prescribed.  - Continue using over the counter medicines as needed as directed. Plain mucinex (guaifenesin) over the counter may further help breakup mucus and help with symptoms.   If you develop any new or worsening symptoms or do not improve in the next 2 to 3 days, please return.  If your symptoms are severe, please go to the emergency room. Follow-up with PCP as needed.  ED Prescriptions     Medication Sig Dispense Auth. Provider   benzonatate (TESSALON) 100 MG capsule Take 1 capsule (100 mg total) by mouth every 8 (eight) hours. 21 capsule Reita May M, FNP   doxycycline (VIBRAMYCIN) 100 MG capsule Take 1 capsule (100 mg total) by mouth 2 (two) times daily for 7 days. 14 capsule Carlisle Beers, FNP   albuterol (VENTOLIN HFA) 108 (90 Base) MCG/ACT inhaler Inhale 1-2 puffs into the lungs every 6 (six) hours as needed for wheezing or shortness of breath. 8 g Carlisle Beers, FNP      PDMP not reviewed this encounter.   Carlisle Beers, [place] 05/20/23 1213

## 2023-05-20 NOTE — ED Triage Notes (Signed)
Symptoms have been present x 2 weeks. Spouse states cough, head congestion "was getting better but came back over last few days". Denies sore throat or pain in general.

## 2023-05-20 NOTE — Discharge Instructions (Signed)
You have bronchitis which is inflammation of the upper airways in your lungs due to a virus. The following medicines will help with your symptoms.   - I gave you a shot of steroid in the clinic to help with inflammation to the chest. - Take antibiotic as prescribed. - You may use albuterol inhaler 1 to 2 puffs every 4-6 hours as needed for cough, shortness of breath, and wheezing. - Take cough medicines as prescribed.  - Continue using over the counter medicines as needed as directed. Plain mucinex (guaifenesin) over the counter may further help breakup mucus and help with symptoms.   If you develop any new or worsening symptoms or do not improve in the next 2 to 3 days, please return.  If your symptoms are severe, please go to the emergency room. Follow-up with PCP as needed.

## 2023-05-26 ENCOUNTER — Other Ambulatory Visit: Payer: Self-pay | Admitting: Cardiology

## 2023-06-02 ENCOUNTER — Other Ambulatory Visit: Payer: Self-pay | Admitting: Cardiology

## 2023-06-03 ENCOUNTER — Ambulatory Visit: Payer: Medicare HMO | Attending: Cardiology | Admitting: Cardiology

## 2023-06-03 VITALS — BP 136/76 | HR 54 | Ht 72.0 in | Wt 201.4 lb

## 2023-06-03 DIAGNOSIS — I2510 Atherosclerotic heart disease of native coronary artery without angina pectoris: Secondary | ICD-10-CM

## 2023-06-03 DIAGNOSIS — E782 Mixed hyperlipidemia: Secondary | ICD-10-CM | POA: Diagnosis not present

## 2023-06-03 DIAGNOSIS — I10 Essential (primary) hypertension: Secondary | ICD-10-CM

## 2023-06-03 MED ORDER — ASPIRIN 81 MG PO TBEC: 81.0000 mg | DELAYED_RELEASE_TABLET | Freq: Every day | ORAL | Status: DC

## 2023-06-03 MED ORDER — ASPIRIN 81 MG PO TBEC: 81.0000 mg | DELAYED_RELEASE_TABLET | Freq: Every day | ORAL | 3 refills | Status: DC

## 2023-06-03 NOTE — Progress Notes (Signed)
 Cardiology Office Note:    Date:  06/03/2023   ID:  [name], DOB Dec 31, 1940, MRN [id number]  PCP:  Ofilia Lamar CROME, MD  Cardiologist:  Jennifer JONELLE Crape, MD   Referring MD: Ofilia Lamar CROME, MD    ASSESSMENT:    1. Coronary artery disease involving native coronary artery of native heart without angina pectoris   2. Benign essential hypertension   3. Mixed hyperlipidemia    PLAN:    In order of problems listed above:  Coronary disease: Secondary prevention stressed with the patient.  Importance of compliance with diet medication stressed any vocalized understanding.  He was advised to walk at least half an hour a day 5 days a week and he promises to do so.  Sublingual nitroglycerin  prescription was sent, its protocol and 911 protocol explained and the patient vocalized understanding questions were answered to the patient's satisfaction Essential hypertension: Blood pressure stable and diet was emphasized.  Lifestyle modification and salt intake issues were discussed.  He is going to keep a track of blood pressures at home and send me a copy. Mixed dyslipidemia: On lipid-lowering medications.  He promises to come back tomorrow and get blood work and we will advise him accordingly. Mildly depressed ejection fraction: Stable at this time.  Patient has multiple comorbidities and peripheral vascular to have any further evaluations or medical interventions or guideline directed medical therapy.  He tells me that he is stable and happy overall with his health.  I respect his wishes. Patient will be seen in follow-up appointment in 12 months or earlier if the patient has any concerns.    Medication Adjustments/Labs and Tests Ordered: Current medicines are reviewed at length with the patient today.  Concerns regarding medicines are outlined above.  Orders Placed This Encounter  Procedures   CBC   Comprehensive metabolic panel   Lipid panel   TSH   EKG 12-Lead   Meds ordered this  encounter  Medications   DISCONTD: aspirin  EC 81 MG tablet    Sig: Take 1 tablet (81 mg total) by mouth daily. Swallow whole.   aspirin  EC 81 MG tablet    Sig: Take 1 tablet (81 mg total) by mouth daily. Swallow whole.    Dispense:  90 tablet    Refill:  3   aspirin  EC 81 MG tablet    Sig: Take 1 tablet (81 mg total) by mouth daily. Swallow whole.    Lot Number?:   [id number]    Expiration Date?:   07/19/2023    Quantity:   64     No chief complaint on file.    History of Present Illness:    [name] is a 83 y.o. male.  Patient has past medical history of coronary artery disease post stenting, essential hypertension and mixed dyslipidemia.  He denies any problems at this time and takes care of activities of daily living.  No chest pain orthopnea or PND.  At the time of my evaluation, the patient is alert awake oriented and in no distress.  He walks on a regular basis.  For some reason he is not taking aspirin  he tells me its because of ecchymosis and such problems.  Past Medical History:  Diagnosis Date   Acute posthemorrhagic anemia 05/04/2021   Formatting of this note might be different from the original. 04/2021: uncertain source, hosp x 2, mult transfusions   Adenomatous polyp of sigmoid colon 03/18/2019   Formatting of this note might be  different from the original. 2020: tubular adenoma   Allergic rhinitis 08/05/2018   Arthritis    Atherosclerotic heart disease of native coronary artery without angina pectoris 04/05/2016   Formatting of this note is different from the original. 11/2012:  DES of LAD, D2 and RCA 04/2021: incidental CT: calcific CAD   Benign essential hypertension 04/05/2016   BPH (benign prostatic hyperplasia) 05/04/2021   Bradycardia by electrocardiogram 11/29/2017   CAD (coronary artery disease), native coronary artery 04/05/2016   Overview:  Formatting of this note may be different from the original.       11/2012:  DES of LAD, D2 and RCA     Cancer  (HCC)    Cardiac murmur 11/07/2020   Cardiomegaly 05/16/2021   Formatting of this note might be different from the original. 04/2021: incidental on CT   Cellulitis 10/20/2020   Formatting of this note might be different from the original. 10/20/2020   COVID-19 virus infection 02/08/2020   Formatting of this note might be different from the original. 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 04/2021: incidental on CT   Gastrointestinal hemorrhage, unspecified 05/11/2021   Formatting of this note might be different from the original. 04/2021: uncertain reason, hosp x 2, plan is outpatient SB camera   Hematochezia 05/04/2021   Hepatosplenomegaly 05/16/2021   Formatting of this note might be different from the original. 04/2021: incidental on CT   Hiatal hernia 05/18/2021   History of cardiac cath 05/04/2021   Formatting of this note might be different from the original. 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 04/2021:  incidental on CT   Peripheral arterial disease (HCC) 05/16/2021   Formatting of this note might be different from the original. 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   Overview:  2018   Postoperative examination 04/24/2018   Preop cardiovascular exam 11/07/2020   Preoperative cardiovascular examination 05/05/2015   Protein-calorie malnutrition, severe (HCC) 05/15/2021   Pulmonary HTN (HCC) 05/04/2021   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   Respiratory infection 05/09/2018   Skin tear of left upper arm without complication 03/15/2020   Slow transit constipation 05/13/2021   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 Mild to moderate mitral valve regurgitation. Mild aortic regurgitation   Vitamin D  deficiency 07/03/2022    Past Surgical History:  Procedure Laterality Date   CARDIAC CATHETERIZATION     CHOLECYSTECTOMY     2002 [facility]   COLON SURGERY     04/2012 [facility]   COLONOSCOPY     CORONARY ANGIOPLASTY     [facility]  2014   HERNIA REPAIR N/A    HIP ARTHROPLASTY     RIGHT HIP  2012   TOTAL HIP ARTHROPLASTY Left 07/21/2015   Procedure: LEFT TOTAL HIP ARTHROPLASTY ANTERIOR APPROACH;  Surgeon: Lonni CINDERELLA Poli, MD;  Location: [facility] OR;  Service: Orthopedics;  Laterality: Left;   UPPER GASTROINTESTINAL ENDOSCOPY N/A     Current Medications: Current Meds  Medication Sig   albuterol  (VENTOLIN  HFA) 108 (90 Base) MCG/ACT inhaler Inhale 1-2 puffs into the lungs every 6 (six) hours as needed for wheezing or shortness of breath.    amLODipine  (NORVASC ) 5 MG tablet Take 5 mg by mouth daily.   Ascorbic Acid (VITAMIN C) 1000 MG tablet Take 1,000 mg by mouth daily.   aspirin  EC 81 MG tablet Take 1 tablet (81 mg total) by mouth daily. Swallow whole.   carvedilol  (COREG ) 6.25 MG tablet Take 1 tablet (6.25 mg total) by mouth 2 (two) times daily.   cholecalciferol (VITAMIN D3) 10 MCG (400 UNIT) TABS tablet Take 400 Units by mouth daily.   cyanocobalamin 1000 MCG tablet Take 1,000 mcg by mouth daily.   dutasteride (AVODART) 0.5 MG capsule Take 1 capsule by mouth daily.   ferrous sulfate 325 (65 FE) MG EC tablet Take 325 mg by mouth in the morning and at bedtime.   fluticasone (FLONASE) 50 MCG/ACT nasal spray Place into both nostrils daily.   gabapentin (NEURONTIN) 100 MG capsule Take 100 mg by mouth as needed (pain).    hydrALAZINE  (APRESOLINE ) 50 MG tablet Take 50 mg by mouth 3 (three) times daily.   hydrochlorothiazide  (MICROZIDE ) 12.5 MG capsule Take 1 capsule (12.5 mg total) by mouth daily.   loratadine  (CLARITIN ) 10 MG tablet Take 10 mg by mouth daily.   nitroGLYCERIN  (NITROSTAT ) 0.4 MG SL tablet Place 1 tablet (0.4 mg total) under the tongue every 5 (five) minutes as needed for chest pain.   pantoprazole (PROTONIX) 40 MG tablet Take 40 mg by mouth daily.   polyethylene glycol powder (GLYCOLAX /MIRALAX ) 17 GM/SCOOP powder Take 17 g by mouth as needed for mild constipation or moderate constipation.   simvastatin  (ZOCOR ) 40 MG tablet Take 1 tablet (40 mg total) by mouth daily. Patient needs appointment for further refills. 1 st attempt   tamsulosin  (FLOMAX ) 0.4 MG CAPS capsule Take 0.4 mg by mouth 2 (two) times daily.    [DISCONTINUED] aspirin  EC 81 MG tablet Take 1 tablet (81 mg total) by mouth daily. Swallow whole.     Allergies:   Patient has no known allergies.   Social History   Socioeconomic History   Marital status: Married    Spouse name: Not on file   Number of children: 4   Years of education: Not on file    Highest education level: Not on file  Occupational History   Not on file  Tobacco Use   Smoking status: Former    Current packs/day: 0.00    Average packs/day: 0.5 packs/day for 17.0 years (8.5 ttl pk-yrs)    Types:  Cigarettes    Start date: 46    Quit date: 62    Years since quitting: 51.0   Smokeless tobacco: Never  Vaping Use   Vaping status: Never Used  Substance and Sexual Activity   Alcohol use: Not Currently    Comment: quit drinking age 50   Drug use: No   Sexual activity: Not on file  Other Topics Concern   Not on file  Social History Narrative   Not on file   Social Drivers of Health   Financial Resource Strain: Low Risk  (05/13/2021)   Received from [facility], [facility]   Overall Financial Resource Strain (CARDIA)    Difficulty of Paying Living Expenses: Not very hard  Food Insecurity: No Food Insecurity (06/22/2021)   Received from [facility], [facility]   Hunger Vital Sign    Worried About Running Out of Food in the Last Year: Never true    Ran Out of Food in the Last Year: Never true  Transportation Needs: Not on file  Physical Activity: Not on file  Stress: No Stress Concern Present (05/13/2021)   Received from [facility], [facility] - Occupational Stress Questionnaire    Feeling of Stress : Not at all  Social Connections: Unknown (10/03/2021)   Received from [facility], [facility]   Social Network    Social Network: Not on file     Family History: The patient's family history includes Cancer in his sister; Colon cancer in his mother; Diabetes in his father and sister; Heart disease in his father; Hypertension in his father.  ROS:   Please see the history of present illness.    All other systems reviewed and are negative.  EKGs/Labs/Other Studies Reviewed:    The following studies were reviewed today:  .SABRAEKG Interpretation Date/Time:  Monday June 03 2023 14:10:43  EST Ventricular Rate:  54 PR Interval:  152 QRS Duration:  100 QT Interval:  448 QTC Calculation: 424 R Axis:   45  Text Interpretation: Sinus bradycardia Otherwise normal ECG When compared with ECG of 05-Jul-2015 14:32, Inverted T waves have replaced nonspecific T wave abnormality in Inferior leads Confirmed by Edwyna Backers [phone]) on 06/03/2023 2:23:54 PM     Recent Labs: 05/06/2023: Hemoglobin 14.0; Platelet Count 185  Recent Lipid Panel    Component Value Date/Time   CHOL 129 05/31/2022 1125   TRIG 104 05/31/2022 1125   HDL 52 05/31/2022 1125   CHOLHDL 2.5 05/31/2022 1125   LDLCALC 58 05/31/2022 1125    Physical Exam:    VS:  BP 136/76   Pulse (!) 54   Ht 6' (1.829 m)   Wt 201 lb 6.4 oz (91.4 kg)   SpO2 99%   BMI 27.31 kg/m     Wt Readings from Last 3 Encounters:  06/03/23 201 lb 6.4 oz (91.4 kg)  05/20/23 210 lb (95.3 kg)  05/06/23 199 lb 8 oz (90.5 kg)     GEN: Patient is in no acute distress HEENT: Normal NECK: No JVD; No carotid bruits LYMPHATICS: No lymphadenopathy CARDIAC: Hear sounds regular, 2/6 systolic murmur at the apex. RESPIRATORY:  Clear to auscultation without rales, wheezing or rhonchi  ABDOMEN: Soft, non-tender, non-distended MUSCULOSKELETAL:  No edema; No deformity  SKIN: Warm and dry NEUROLOGIC:  Alert and oriented x 3 PSYCHIATRIC:  Normal affect   Signed, Backers JONELLE Edwyna, MD  06/03/2023 2:39 PM    [facility]

## 2023-06-03 NOTE — Patient Instructions (Signed)
 Medication Instructions:  Your physician has recommended you make the following change in your medication:   Start taking 81 mg coated aspirin  daily.  *If you need a refill on your cardiac medications before your next appointment, please call your pharmacy*   Lab Work: Your physician recommends that you return for lab work in: CMP, CBC, TSH and lipids. You need to have labs done when you are fasting.  You can come Monday through Friday 8:30 am to 12:00 pm and 1:15 to 4:30. You do not need to make an appointment as the order has already been placed.   If you have labs (blood work) drawn today and your tests are completely normal, you will receive your results only by: MyChart Message (if you have MyChart) OR A paper copy in the mail If you have any lab test that is abnormal or we need to change your treatment, we will call you to review the results.   Testing/Procedures: None ordered   Follow-Up: At [facility], you and your health needs are our priority.  As part of our continuing mission to provide you with exceptional heart care, we have created designated Provider Care Teams.  These Care Teams include your primary Cardiologist (physician) and Advanced Practice Providers (APPs -  Physician Assistants and Nurse Practitioners) who all work together to provide you with the care you need, when you need it.  We recommend signing up for the patient portal called MyChart.  Sign up information is provided on this After Visit Summary.  MyChart is used to connect with patients for Virtual Visits (Telemedicine).  Patients are able to view lab/test results, encounter notes, upcoming appointments, etc.  Non-urgent messages can be sent to your provider as well.   To learn more about what you can do with MyChart, go to [email or web address].    Your next appointment:   12 month(s)  The format for your next appointment:   In Person  Provider:   Jennifer Crape, MD    Other  Instructions none  Important Information About Sugar

## 2023-06-05 LAB — COMPREHENSIVE METABOLIC PANEL
ALT: 13 [IU]/L (ref 0–44)
AST: 18 [IU]/L (ref 0–40)
Albumin: 3.7 g/dL — ABNORMAL LOW (ref 3.8–4.8)
Alkaline Phosphatase: 66 [IU]/L (ref 44–121)
BUN/Creatinine Ratio: 13 (ref 10–24)
BUN: 13 mg/dL (ref 8–27)
Bilirubin Total: 0.8 mg/dL (ref 0.0–1.2)
CO2: 29 mmol/L (ref 20–29)
Calcium: 9 mg/dL (ref 8.6–10.2)
Chloride: 102 mmol/L (ref 96–106)
Creatinine, Ser: 0.98 mg/dL (ref 0.76–1.27)
Globulin, Total: 2.7 g/dL (ref 1.5–4.5)
Glucose: 91 mg/dL (ref 70–99)
Potassium: 4.7 mmol/L (ref 3.5–5.2)
Sodium: 142 mmol/L (ref 134–144)
Total Protein: 6.4 g/dL (ref 6.0–8.5)
eGFR: 78 mL/min/{1.73_m2} (ref >59)

## 2023-06-05 LAB — CBC
Hematocrit: 42.5 % (ref 37.5–51.0)
Hemoglobin: 14.1 g/dL (ref 13.0–17.7)
MCH: 32.6 pg (ref 26.6–33.0)
MCHC: 33.2 g/dL (ref 31.5–35.7)
MCV: 98 fL — ABNORMAL HIGH (ref 79–97)
Platelets: 222 10*3/uL (ref 150–450)
RBC: 4.33 x10E6/uL (ref 4.14–5.80)
RDW: 12.8 % (ref 11.6–15.4)
WBC: 7.5 10*3/uL (ref 3.4–10.8)

## 2023-06-05 LAB — LIPID PANEL
Chol/HDL Ratio: 2.3 ratio (ref 0.0–5.0)
Cholesterol, Total: 117 mg/dL (ref 100–199)
HDL: 51 mg/dL
LDL Chol Calc (NIH): 45 mg/dL (ref 0–99)
Triglycerides: 116 mg/dL (ref 0–149)
VLDL Cholesterol Cal: 21 mg/dL (ref 5–40)

## 2023-06-05 LAB — TSH: TSH: 0.418 u[IU]/mL — ABNORMAL LOW (ref 0.450–4.500)

## 2023-06-07 ENCOUNTER — Telehealth: Payer: Self-pay | Admitting: Cardiology

## 2023-06-07 NOTE — Telephone Encounter (Signed)
Pt wife Ivor Messier is requesting a callback regarding his TSH Levels. Although she spoke with nurse already she'd like to see about going over those particular results. Please advise

## 2023-06-07 NOTE — Telephone Encounter (Signed)
Patient identification verified by 2 forms. Marilynn Rail, RN    Called and spoke to patients wife Demetrius Charity states:   -would like to know TSH level  Informed Cora:   -TSH 0.418 (low)    -best to follow up with PCP  Cora verbalized understanding, no questions at this time

## 2023-06-19 ENCOUNTER — Other Ambulatory Visit: Payer: Self-pay | Admitting: Cardiology

## 2023-06-22 ENCOUNTER — Other Ambulatory Visit: Payer: Self-pay | Admitting: Cardiology

## 2023-06-26 ENCOUNTER — Other Ambulatory Visit: Payer: Self-pay | Admitting: Cardiology

## 2024-01-21 ENCOUNTER — Other Ambulatory Visit: Payer: Self-pay

## 2024-01-21 DIAGNOSIS — I2510 Atherosclerotic heart disease of native coronary artery without angina pectoris: Secondary | ICD-10-CM

## 2024-01-21 MED ORDER — ASPIRIN 81 MG PO TBEC: 81.0000 mg | DELAYED_RELEASE_TABLET | Freq: Every day | ORAL | 0 refills | Status: DC

## 2024-01-25 ENCOUNTER — Other Ambulatory Visit: Payer: Self-pay | Admitting: Cardiology

## 2024-04-09 ENCOUNTER — Other Ambulatory Visit: Payer: Self-pay | Admitting: Cardiology

## 2024-04-09 DIAGNOSIS — I2510 Atherosclerotic heart disease of native coronary artery without angina pectoris: Secondary | ICD-10-CM

## 2024-06-04 ENCOUNTER — Ambulatory Visit: Attending: Cardiology | Admitting: Cardiology

## 2024-06-04 ENCOUNTER — Encounter: Payer: Self-pay | Admitting: Cardiology

## 2024-06-04 VITALS — BP 138/76 | HR 59 | Ht 72.0 in | Wt 204.2 lb

## 2024-06-04 DIAGNOSIS — Z8679 Personal history of other diseases of the circulatory system: Secondary | ICD-10-CM | POA: Insufficient documentation

## 2024-06-04 DIAGNOSIS — I10 Essential (primary) hypertension: Secondary | ICD-10-CM | POA: Diagnosis not present

## 2024-06-04 DIAGNOSIS — I2510 Atherosclerotic heart disease of native coronary artery without angina pectoris: Secondary | ICD-10-CM | POA: Diagnosis not present

## 2024-06-04 DIAGNOSIS — E782 Mixed hyperlipidemia: Secondary | ICD-10-CM | POA: Diagnosis not present

## 2024-06-04 NOTE — Patient Instructions (Signed)
 Medication Instructions:  Your physician recommends that you continue on your current medications as directed. Please refer to the Current Medication list given to you today.  *If you need a refill on your cardiac medications before your next appointment, please call your pharmacy*   Lab Work: None ordered If you have labs (blood work) drawn today and your tests are completely normal, you will receive your results only by: MyChart Message (if you have MyChart) OR A paper copy in the mail If you have any lab test that is abnormal or we need to change your treatment, we will call you to review the results.  Testing/Procedures: Your physician has requested that you have an echocardiogram. Echocardiography is a painless test that uses sound waves to create images of your heart. It provides your doctor with information about the size and shape of your heart and how well your heart's chambers and valves are working. This procedure takes approximately one hour. There are no restrictions for this procedure. Please do NOT wear cologne, perfume, aftershave, or lotions (deodorant is allowed). Please arrive 15 minutes prior to your appointment time.  Please note: We ask at that you not bring children with you during ultrasound (echo/ vascular) testing. Due to room size and safety concerns, children are not allowed in the ultrasound rooms during exams. Our front office staff cannot provide observation of children in our lobby area while testing is being conducted. An adult accompanying a patient to their appointment will only be allowed in the ultrasound room at the discretion of the ultrasound technician under special circumstances. We apologize for any inconvenience.  Follow-Up: At [facility], you and your health needs are our priority.  As part of our continuing mission to provide you with exceptional heart care, we have created designated Provider Care Teams.  These Care Teams include your primary  Cardiologist (physician) and Advanced Practice Providers (APPs -  Physician Assistants and Nurse Practitioners) who all work together to provide you with the care you need, when you need it.  We recommend signing up for the patient portal called MyChart.  Sign up information is provided on this After Visit Summary.  MyChart is used to connect with patients for Virtual Visits (Telemedicine).  Patients are able to view lab/test results, encounter notes, upcoming appointments, etc.  Non-urgent messages can be sent to your provider as well.   To learn more about what you can do with MyChart, go to [email or web address].    Your next appointment:   9 month(s)  The format for your next appointment:   In Person  Provider:   Jennifer Crape, MD   Other Instructions Echocardiogram An echocardiogram is a test that uses sound waves (ultrasound) to produce images of the heart. Images from an echocardiogram can provide important information about: Heart size and shape. The size and thickness and movement of your heart's walls. Heart muscle function and strength. Heart valve function or if you have stenosis. Stenosis is when the heart valves are too narrow. If blood is flowing backward through the heart valves (regurgitation). A tumor or infectious growth around the heart valves. Areas of heart muscle that are not working well because of poor blood flow or injury from a heart attack. Aneurysm detection. An aneurysm is a weak or damaged part of an artery wall. The wall bulges out from the normal force of blood pumping through the body. Tell a health care provider about: Any allergies you have. All medicines you are taking, including vitamins, herbs,  eye drops, creams, and over-the-counter medicines. Any blood disorders you have. Any surgeries you have had. Any medical conditions you have. Whether you are pregnant or may be pregnant. What are the risks? Generally, this is a safe test. However,  problems may occur, including an allergic reaction to dye (contrast) that may be used during the test. What happens before the test? No specific preparation is needed. You may eat and drink normally. What happens during the test? You will take off your clothes from the waist up and put on a hospital gown. Electrodes or electrocardiogram (ECG)patches may be placed on your chest. The electrodes or patches are then connected to a device that monitors your heart rate and rhythm. You will lie down on a table for an ultrasound exam. A gel will be applied to your chest to help sound waves pass through your skin. A handheld device, called a transducer, will be pressed against your chest and moved over your heart. The transducer produces sound waves that travel to your heart and bounce back (or echo back) to the transducer. These sound waves will be captured in real-time and changed into images of your heart that can be viewed on a video monitor. The images will be recorded on a computer and reviewed by your health care provider. You may be asked to change positions or hold your breath for a short time. This makes it easier to get different views or better views of your heart. In some cases, you may receive contrast through an IV in one of your veins. This can improve the quality of the pictures from your heart. The procedure may vary among health care providers and hospitals.   What can I expect after the test? You may return to your normal, everyday life, including diet, activities, and medicines, unless your health care provider tells you not to do that. Follow these instructions at home: It is up to you to get the results of your test. Ask your health care provider, or the department that is doing the test, when your results will be ready. Keep all follow-up visits. This is important. Summary An echocardiogram is a test that uses sound waves (ultrasound) to produce images of the heart. Images from an  echocardiogram can provide important information about the size and shape of your heart, heart muscle function, heart valve function, and other possible heart problems. You do not need to do anything to prepare before this test. You may eat and drink normally. After the echocardiogram is completed, you may return to your normal, everyday life, unless your health care provider tells you not to do that. This information is not intended to replace advice given to you by your health care provider. Make sure you discuss any questions you have with your health care provider. Document Revised: 12/29/2019 Document Reviewed: 12/29/2019 Elsevier Patient Education  2021 [employer]   Important Information About Sugar

## 2024-06-04 NOTE — Progress Notes (Signed)
 " Cardiology Office Note:    Date:  06/04/2024   ID:  [name], DOB January 20, 1941, MRN [id number]  PCP:  Ofilia Lamar CROME, MD  Cardiologist:  Jennifer JONELLE Crape, MD   Referring MD: Ofilia Lamar CROME, MD    ASSESSMENT:    1. Coronary artery disease involving native coronary artery of native heart without angina pectoris   2. Benign essential hypertension   3. Mixed hyperlipidemia   4. History of cardiomyopathy    PLAN:    In order of problems listed above:  Primary prevention stressed with the patient.  Importance of compliance with diet medication stressed and patient verbalized standing. Essential hypertension: Blood pressure is stable and diet was emphasized.  Lifestyle modification urged. Mixed dyslipidemia: On lipid-lowering medications followed by primary care.  Goal LDL less than 60. History of cardiomyopathy: Stable.  CHF education given and he understands.  Will do an echocardiogram to follow-up with this.  In view of advanced age and memory loss and such issues he is not an optimal candidate for guideline directed medical therapy.  Also his blood pressure is borderline and I worry about hypotension Patient will be seen in follow-up appointment in 6 months or earlier if the patient has any concerns.    Medication Adjustments/Labs and Tests Ordered: Current medicines are reviewed at length with the patient today.  Concerns regarding medicines are outlined above.  Orders Placed This Encounter  Procedures   EKG 12-Lead   ECHOCARDIOGRAM COMPLETE   No orders of the defined types were placed in this encounter.    No chief complaint on file.    History of Present Illness:    [name] is a 84 y.o. male.  Patient has past medical history of cardiomyopathy, essential hypertension, mixed dyslipidemia.  His wife mentions to me that he has significant memory loss.  He denies any chest pain orthopnea or PND.  At the time of my evaluation, the patient is alert awake  oriented and in no distress.  He is active but does not exercise on a regular basis.  Past Medical History:  Diagnosis Date   Acute posthemorrhagic anemia 05/04/2021   Formatting of this note might be different from the original. 04/2021: uncertain source, hosp x 2, mult transfusions   Adenomatous polyp of sigmoid colon 03/18/2019   Formatting of this note might be different from the original. 2020: tubular adenoma   Allergic rhinitis 08/05/2018   Arthritis    Atherosclerotic heart disease of native coronary artery without angina pectoris 04/05/2016   Formatting of this note is different from the original. 11/2012:  DES of LAD, D2 and RCA 04/2021: incidental CT: calcific CAD   Benign essential hypertension 04/05/2016   BPH (benign prostatic hyperplasia) 05/04/2021   Bradycardia by electrocardiogram 11/29/2017   CAD (coronary artery disease), native coronary artery 04/05/2016   Overview:  Formatting of this note may be different from the original.       11/2012:  DES of LAD, D2 and RCA     Cancer (HCC)    Cardiac murmur 11/07/2020   Cardiomegaly 05/16/2021   Formatting of this note might be different from the original. 04/2021: incidental on CT   Cellulitis 10/20/2020   Formatting of this note might be different from the original. 10/20/2020   COVID-19 virus infection 02/08/2020   Formatting of this note might be different from the original. 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 04/2021: incidental on CT   Gastrointestinal hemorrhage, unspecified 05/11/2021   Formatting of this note might be different from the original. 04/2021: uncertain  reason, hosp x 2, plan is outpatient SB camera   Hematochezia 05/04/2021   Hepatosplenomegaly 05/16/2021   Formatting of this note might be different from the original. 04/2021: incidental on CT   Hiatal hernia 05/18/2021   History of cardiac cath 05/04/2021   Formatting of this note might be different from the original. 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 04/2021: incidental on CT   Peripheral arterial disease 05/16/2021   Formatting of this note might be different from the original. 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   Overview:  2018   Postoperative examination 04/24/2018   Preop cardiovascular exam 11/07/2020   Preoperative cardiovascular examination 05/05/2015   Protein-calorie malnutrition, severe 05/15/2021   Pulmonary HTN (HCC) 05/04/2021   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   Respiratory infection 05/09/2018   Skin tear of left upper arm without complication 03/15/2020   Slow transit constipation 05/13/2021   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 Mild to moderate mitral valve regurgitation. Mild aortic regurgitation   Vitamin D  deficiency 07/03/2022    Past Surgical History:  Procedure Laterality Date   CARDIAC CATHETERIZATION     CHOLECYSTECTOMY     2002 [facility]   COLON SURGERY     04/2012 [facility]   COLONOSCOPY     CORONARY ANGIOPLASTY     [facility]  2014   HERNIA REPAIR N/A    HIP ARTHROPLASTY     RIGHT HIP  2012   TOTAL HIP ARTHROPLASTY Left 07/21/2015   Procedure: LEFT TOTAL HIP ARTHROPLASTY ANTERIOR APPROACH;  Surgeon: Lonni CINDERELLA Poli, MD;  Location: [facility] OR;  Service: Orthopedics;  Laterality: Left;   UPPER GASTROINTESTINAL ENDOSCOPY N/A     Current Medications: Active Medications[1]   Allergies:   Patient has no known allergies.   Social History   Socioeconomic History   Marital status: Married    Spouse name: Not on file   Number of children: 4   Years of education: Not on file   Highest education level:  Not on file  Occupational History   Not on file  Tobacco Use   Smoking status: Former    Current packs/day: 0.00    Average packs/day: 0.5 packs/day for 17.0 years (8.5 ttl pk-yrs)    Types: Cigarettes    Start date: 8    Quit date: 64    Years since quitting: 52.0   Smokeless tobacco: Never  Vaping Use   Vaping status: Never Used  Substance and Sexual Activity   Alcohol use: Not Currently    Comment: quit drinking age 52   Drug use: No   Sexual activity: Not on file  Other Topics Concern   Not on file  Social History Narrative   Not on file   Social Drivers of Health   Tobacco Use: Medium Risk (06/04/2024)   Patient History    Smoking Tobacco Use: Former    Smokeless Tobacco Use: Never    Passive Exposure: Not on Actuary Strain: Not on file  Food Insecurity: Low Risk (02/27/2024)   Received from [facility]   Epic    Within the past 12 months, you worried that your food would run out before you got money to buy more: Never true    Within the past 12 months, the food you bought just didn't last and you didn't have money to get more. : Never true  Transportation Needs: No Transportation Needs (02/27/2024)   Received from [facility]    In the past 12 months, has lack of reliable transportation kept you from medical appointments, meetings, work or from getting things needed for daily living? : No  Physical Activity: Not on file  Stress: Not on file  Social Connections: Unknown (10/03/2021)   [facility] from [facility]   Social Network    Social Network: Not on file  Depression (PHQ2-9): Not on file  Alcohol Screen: Not on file  Housing: Low Risk (02/27/2024)   Received from [facility]   Epic    What is your living situation today?: I have a steady place to live    Think about the place you live. Do you have problems with any of the following? Choose all that apply:: None/None on this list  Utilities: Low Risk (02/27/2024)   Received from [facility]   Utilities    In the past 12 months has the electric, gas, oil, or water company threatened to shut off services in your home? : No  Health Literacy: Not on file     Family History: The patient's family history includes Cancer in his sister; Colon cancer in his mother; Diabetes in his father and sister; Heart disease in his father; Hypertension in his father.  ROS:   Please see the history of present illness.    All other systems reviewed and are negative.  EKGs/Labs/Other Studies Reviewed:    The following studies were reviewed today: .SABRAEKG Interpretation Date/Time:  Thursday June 04 2024 10:02:23 EST Ventricular Rate:  59 PR Interval:  152 QRS Duration:  104 QT Interval:  428 QTC Calculation: 423 R Axis:   5  Text Interpretation: Sinus bradycardia with sinus arrhythmia Left ventricular hypertrophy  with repolarization abnormality Abnormal ECG When compared with ECG of 03-Jun-2023 14:10, T wave inversion more evident in Inferior leads T wave inversion now evident in Lateral leads Confirmed by Edwyna Backers [phone]) on 06/04/2024 10:23:20 AM     Recent Labs: No results found for requested labs within last 365 days.  Recent Lipid Panel    Component Value Date/Time   CHOL 117 06/04/2023 1005   TRIG 116 06/04/2023 1005   HDL 51 06/04/2023 1005   CHOLHDL 2.3 06/04/2023 1005   LDLCALC 45 06/04/2023 1005    Physical Exam:    VS:  BP 138/76   Pulse (!) 59   Ht 6' (1.829 m)   Wt 204 lb 3.2 oz (92.6 kg)   SpO2 94%   BMI 27.69 kg/m     Wt Readings from Last 3 Encounters:  06/04/24 204 lb 3.2 oz (92.6 kg)  06/03/23 201 lb 6.4 oz (91.4 kg)  05/20/23 210 lb (95.3 kg)     GEN: Patient is in no acute distress HEENT: Normal NECK: No JVD; No carotid bruits LYMPHATICS: No lymphadenopathy CARDIAC: Hear sounds regular, 2/6 systolic murmur at the apex. RESPIRATORY:  Clear to auscultation without rales, wheezing or rhonchi  ABDOMEN: Soft, non-tender, non-distended MUSCULOSKELETAL:  No edema; No deformity  SKIN: Warm and dry NEUROLOGIC:  Alert and oriented x 3 PSYCHIATRIC:  Normal affect   Signed, Jennifer JONELLE Crape, MD  06/04/2024 10:34 AM    [facility]     [1]  Current Meds  Medication Sig   albuterol  (VENTOLIN  HFA) 108 (90 Base) MCG/ACT inhaler Inhale 1-2 puffs into the lungs every 6 (six) hours as needed for wheezing or shortness of breath.   Ascorbic Acid (VITAMIN C) 1000 MG tablet Take 1,000 mg by mouth daily.   aspirin  EC (ASPIRIN  LOW DOSE) 81 MG tablet TAKE 1 TABLET EVERY DAY, SWALLOW WHOLE   carvedilol  (COREG ) 6.25 MG tablet TAKE 1 TABLET TWICE DAILY   cholecalciferol (VITAMIN D3) 10 MCG (400 UNIT) TABS tablet Take 400 Units by mouth daily.   cyanocobalamin  1000 MCG tablet Take 1,000 mcg by mouth daily.   ferrous sulfate 325 (65 FE) MG EC tablet  Take 325 mg by mouth in the morning and at bedtime.   gabapentin (NEURONTIN) 100 MG capsule Take 100 mg by mouth as needed (pain).    hydrochlorothiazide  (MICROZIDE ) 12.5 MG capsule TAKE 1 CAPSULE EVERY DAY   loratadine  (CLARITIN ) 10 MG tablet Take 10 mg by mouth daily.   nitroGLYCERIN  (NITROSTAT ) 0.4 MG SL tablet Place 1 tablet (0.4 mg total) under the tongue every 5 (five) minutes as needed for chest pain.   simvastatin  (ZOCOR ) 40 MG tablet TAKE 1 TABLET EVERY DAY   tamsulosin  (FLOMAX ) 0.4 MG CAPS capsule Take 0.4 mg by mouth 2 (two) times daily.    "

## 2024-06-22 ENCOUNTER — Other Ambulatory Visit: Payer: Self-pay | Admitting: Cardiology

## 2024-06-29 ENCOUNTER — Ambulatory Visit
